# Patient Record
Sex: Female | Born: 1945 | Race: Black or African American | Hispanic: No | Marital: Married | Smoking: Former smoker
Health system: Southern US, Community
[De-identification: ages and names within clinical notes are randomized; demographics above are authoritative.]

## PROBLEM LIST (undated history)

## (undated) DIAGNOSIS — N189 Chronic kidney disease, unspecified: Secondary | ICD-10-CM

## (undated) DIAGNOSIS — I10 Essential (primary) hypertension: Secondary | ICD-10-CM

## (undated) DIAGNOSIS — I739 Peripheral vascular disease, unspecified: Secondary | ICD-10-CM

## (undated) DIAGNOSIS — R0989 Other specified symptoms and signs involving the circulatory and respiratory systems: Secondary | ICD-10-CM

## (undated) DIAGNOSIS — I2089 Other forms of angina pectoris: Secondary | ICD-10-CM

## (undated) DIAGNOSIS — I208 Other forms of angina pectoris: Secondary | ICD-10-CM

## (undated) DIAGNOSIS — M109 Gout, unspecified: Secondary | ICD-10-CM

## (undated) DIAGNOSIS — E669 Obesity, unspecified: Secondary | ICD-10-CM

## (undated) DIAGNOSIS — M1990 Unspecified osteoarthritis, unspecified site: Secondary | ICD-10-CM

## (undated) DIAGNOSIS — E785 Hyperlipidemia, unspecified: Secondary | ICD-10-CM

## (undated) DIAGNOSIS — I480 Paroxysmal atrial fibrillation: Secondary | ICD-10-CM

## (undated) DIAGNOSIS — R011 Cardiac murmur, unspecified: Secondary | ICD-10-CM

## (undated) DIAGNOSIS — K635 Polyp of colon: Secondary | ICD-10-CM

## (undated) DIAGNOSIS — I219 Acute myocardial infarction, unspecified: Secondary | ICD-10-CM

## (undated) DIAGNOSIS — I509 Heart failure, unspecified: Secondary | ICD-10-CM

## (undated) DIAGNOSIS — I2510 Atherosclerotic heart disease of native coronary artery without angina pectoris: Secondary | ICD-10-CM

## (undated) HISTORY — DX: Essential (primary) hypertension: I10

## (undated) HISTORY — DX: Paroxysmal atrial fibrillation: I48.0

## (undated) HISTORY — DX: Hyperlipidemia, unspecified: E78.5

## (undated) HISTORY — DX: Atherosclerotic heart disease of native coronary artery without angina pectoris: I25.10

## (undated) HISTORY — DX: Other forms of angina pectoris: I20.8

## (undated) HISTORY — DX: Polyp of colon: K63.5

## (undated) HISTORY — DX: Peripheral vascular disease, unspecified: I73.9

## (undated) HISTORY — DX: Chronic kidney disease, unspecified: N18.9

## (undated) HISTORY — PX: LEG AMPUTATION BELOW KNEE: SHX694

## (undated) HISTORY — DX: Unspecified osteoarthritis, unspecified site: M19.90

## (undated) HISTORY — DX: Other forms of angina pectoris: I20.89

## (undated) HISTORY — DX: Gout, unspecified: M10.9

## (undated) HISTORY — DX: Obesity, unspecified: E66.9

## (undated) HISTORY — DX: Other specified symptoms and signs involving the circulatory and respiratory systems: R09.89

## (undated) HISTORY — DX: Cardiac murmur, unspecified: R01.1

## (undated) HISTORY — DX: Heart failure, unspecified: I50.9

## (undated) HISTORY — DX: Acute myocardial infarction, unspecified: I21.9

---

## 1995-10-13 HISTORY — PX: CORONARY ARTERY BYPASS GRAFT: SHX141

## 2005-01-12 ENCOUNTER — Ambulatory Visit: Payer: Self-pay | Admitting: Internal Medicine

## 2005-07-20 ENCOUNTER — Ambulatory Visit: Payer: Self-pay | Admitting: Internal Medicine

## 2005-08-25 ENCOUNTER — Inpatient Hospital Stay: Payer: Self-pay | Admitting: Internal Medicine

## 2005-08-25 ENCOUNTER — Other Ambulatory Visit: Payer: Self-pay

## 2005-08-26 ENCOUNTER — Other Ambulatory Visit: Payer: Self-pay

## 2006-06-28 ENCOUNTER — Inpatient Hospital Stay: Payer: Self-pay | Admitting: Internal Medicine

## 2006-07-15 ENCOUNTER — Emergency Department: Payer: Self-pay | Admitting: Emergency Medicine

## 2006-11-02 ENCOUNTER — Ambulatory Visit: Payer: Self-pay | Admitting: Internal Medicine

## 2007-03-21 ENCOUNTER — Ambulatory Visit: Payer: Self-pay | Admitting: Internal Medicine

## 2007-12-02 ENCOUNTER — Ambulatory Visit: Payer: Self-pay | Admitting: Internal Medicine

## 2008-02-12 ENCOUNTER — Other Ambulatory Visit: Payer: Self-pay

## 2008-02-12 ENCOUNTER — Inpatient Hospital Stay: Payer: Self-pay | Admitting: Internal Medicine

## 2008-02-13 ENCOUNTER — Other Ambulatory Visit: Payer: Self-pay

## 2008-02-17 ENCOUNTER — Other Ambulatory Visit: Payer: Self-pay

## 2008-03-31 ENCOUNTER — Inpatient Hospital Stay: Payer: Self-pay | Admitting: Internal Medicine

## 2008-03-31 ENCOUNTER — Other Ambulatory Visit: Payer: Self-pay

## 2008-04-01 ENCOUNTER — Other Ambulatory Visit: Payer: Self-pay

## 2008-04-07 ENCOUNTER — Other Ambulatory Visit: Payer: Self-pay

## 2008-10-29 ENCOUNTER — Emergency Department: Payer: Self-pay | Admitting: Emergency Medicine

## 2009-08-31 IMAGING — CR DG CHEST 1V PORT
1 series · 1 of 1 positions shown · non-contrast
Comparison: none

REASON FOR EXAM: cp
COMMENTS:

[view not recorded]
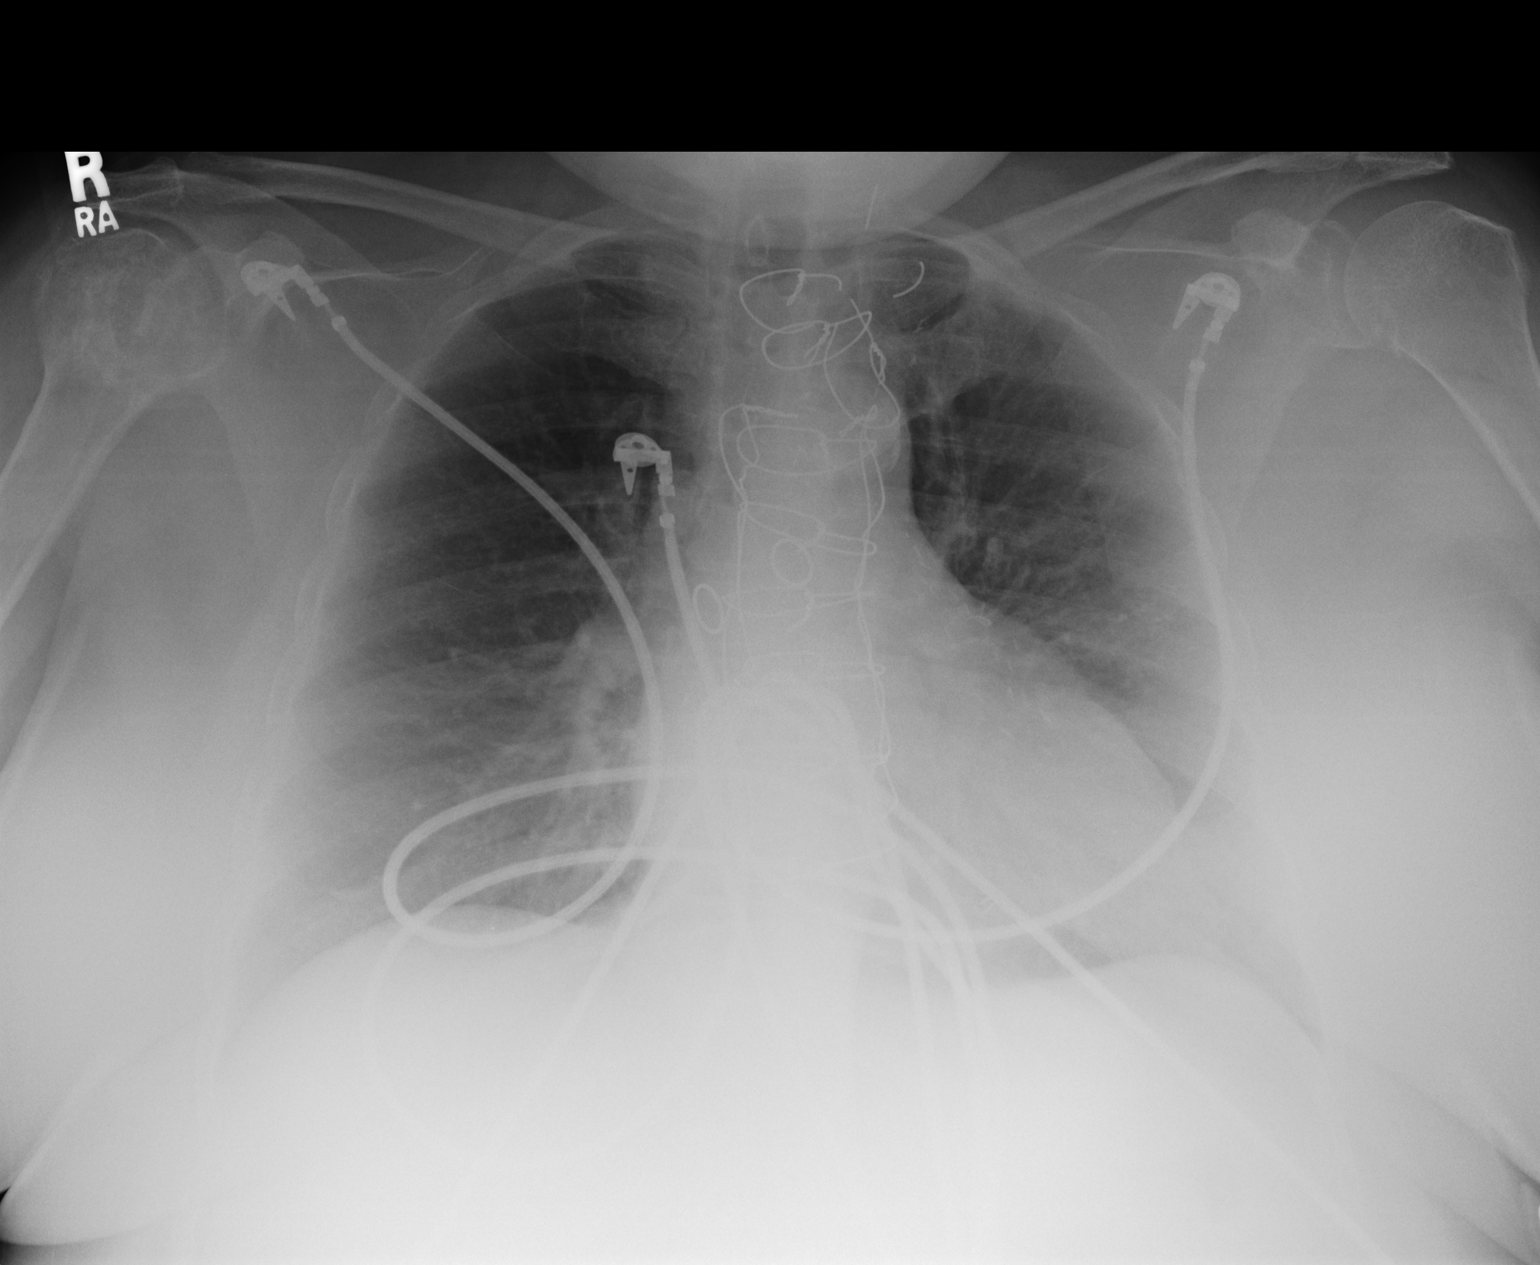

[1 of 1 positions shown; findings below may reference images not displayed]

PROCEDURE:     DXR - DXR PORTABLE CHEST SINGLE VIEW  - March 31, 2008  [DATE]

RESULT:     Comparison is made to prior study dated 02/14/08.

The patient has taken a shallow inspiration. With technique taken into
consideration the patient is status post median sternotomy and coronary
artery bypass grafting. The cardiac silhouette is moderately enlarged.
IMPRESSION:

## 2009-12-17 ENCOUNTER — Inpatient Hospital Stay: Payer: Self-pay | Admitting: Internal Medicine

## 2010-06-28 ENCOUNTER — Inpatient Hospital Stay: Payer: Self-pay | Admitting: Internal Medicine

## 2010-08-09 ENCOUNTER — Inpatient Hospital Stay: Payer: Self-pay | Admitting: Internal Medicine

## 2011-07-22 ENCOUNTER — Ambulatory Visit: Payer: Self-pay | Admitting: Internal Medicine

## 2011-08-30 ENCOUNTER — Emergency Department: Payer: Self-pay | Admitting: Emergency Medicine

## 2011-11-29 LAB — COMPREHENSIVE METABOLIC PANEL
Albumin: 3.3 g/dL — ABNORMAL LOW (ref 3.4–5.0)
Alkaline Phosphatase: 102 U/L (ref 50–136)
BUN: 51 mg/dL — ABNORMAL HIGH (ref 7–18)
Bilirubin,Total: 0.2 mg/dL (ref 0.2–1.0)
Chloride: 100 mmol/L (ref 98–107)
Creatinine: 1.93 mg/dL — ABNORMAL HIGH (ref 0.60–1.30)
EGFR (African American): 33 — ABNORMAL LOW
Glucose: 205 mg/dL — ABNORMAL HIGH (ref 65–99)
SGOT(AST): 14 U/L — ABNORMAL LOW (ref 15–37)
SGPT (ALT): 13 U/L
Total Protein: 9.2 g/dL — ABNORMAL HIGH (ref 6.4–8.2)

## 2011-11-29 LAB — APTT: Activated PTT: 27.5 secs (ref 23.6–35.9)

## 2011-11-29 LAB — CBC
MCH: 27 pg (ref 26.0–34.0)
MCV: 83 fL (ref 80–100)
WBC: 16.4 10*3/uL — ABNORMAL HIGH (ref 3.6–11.0)

## 2011-11-29 LAB — PROTIME-INR: Prothrombin Time: 14.1 secs (ref 11.5–14.7)

## 2011-11-29 IMAGING — CT CT ABD-PELV W/O CM
1 of 2 series · 15 of 32 positions shown, 19 images · non-contrast
Comparison: None

REASON FOR EXAM: (1) abd pain; (2) abd pain
COMMENTS:

PROCEDURE:     CT  - CT ABDOMEN AND PELVIS W[DATE]  [DATE]
RESULT:     Indication: Abdominal pain 02/15/2008
TECHNIQUE: Multiple axial images from the lung bases to the symphysis pubis
were obtained with oral and without intravenous contrast.

[Series 2: 3mm soft tissue · axial · 0.80mm/px · z∈[-937,-511]mm · 15 of 156 slices shown, 19 images]
[im 7/156  soft-tissue]
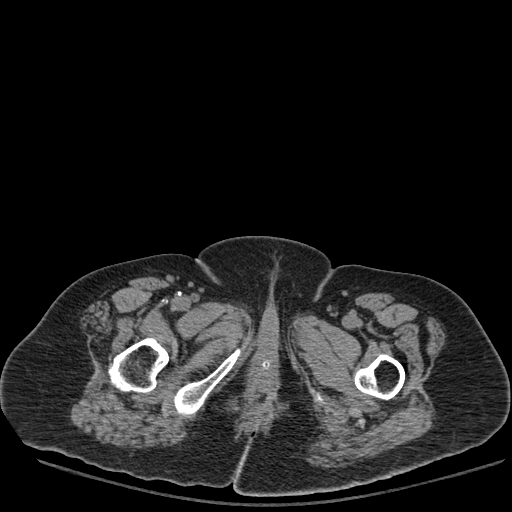
[im 7/156  bone]
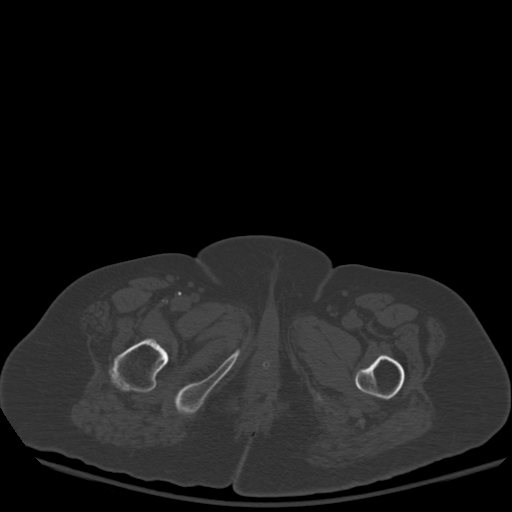
[im 21/156  soft-tissue]
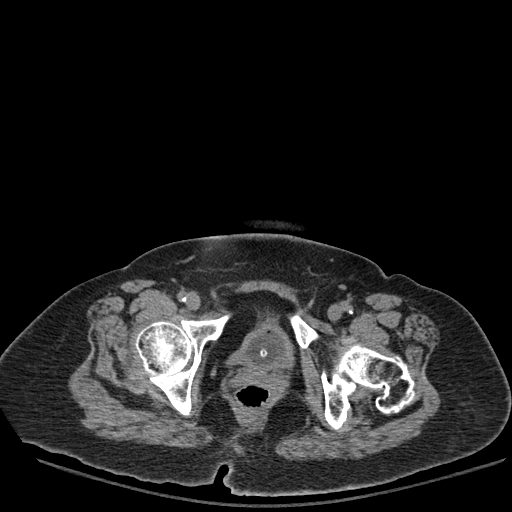
[im 34/156  soft-tissue]
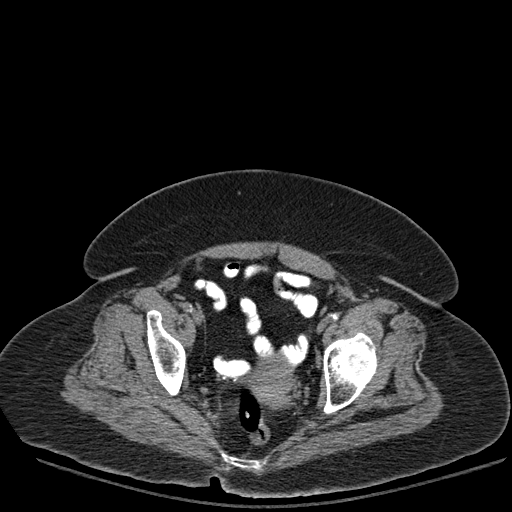
[im 41/156  soft-tissue]
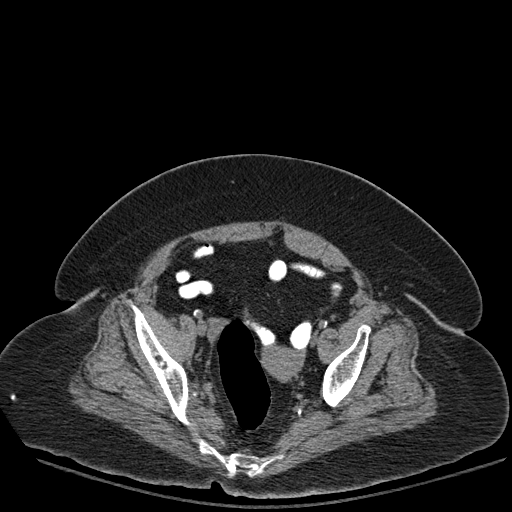
[im 54/156  soft-tissue]
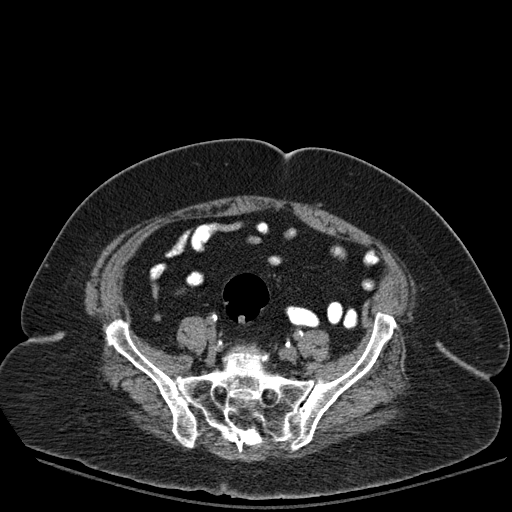
[im 68/156  soft-tissue]
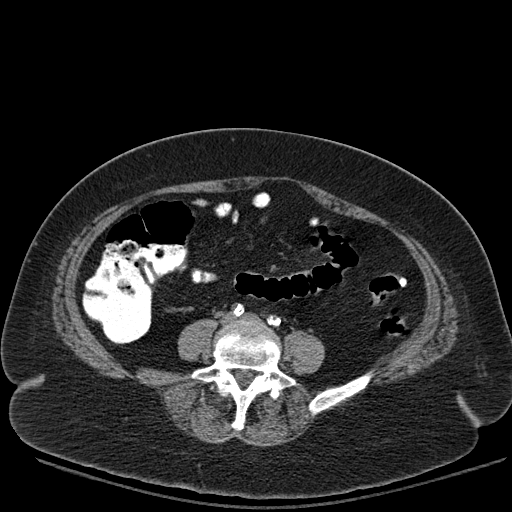
[im 81/156  soft-tissue]
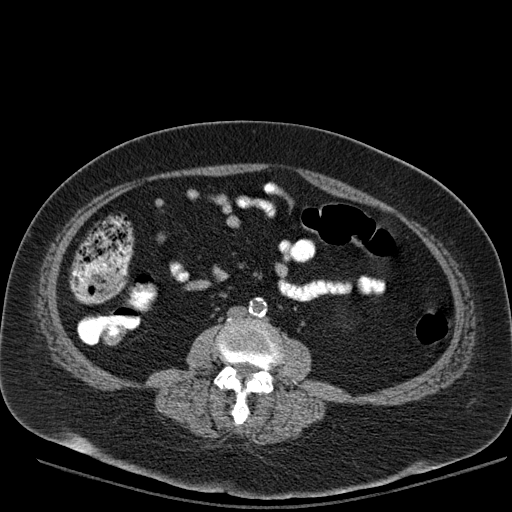
[im 88/156  soft-tissue]
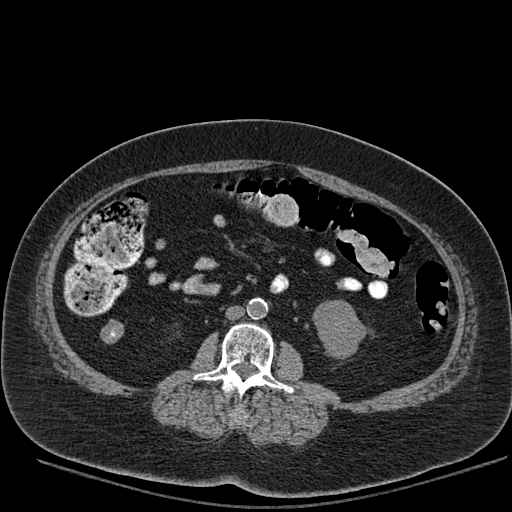
[im 102/156  soft-tissue]
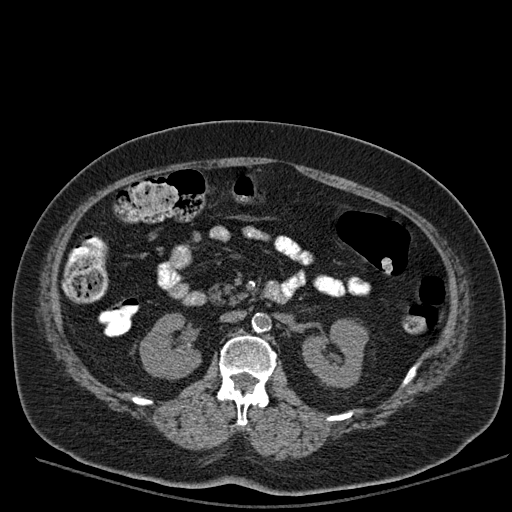
[im 102/156  bone]
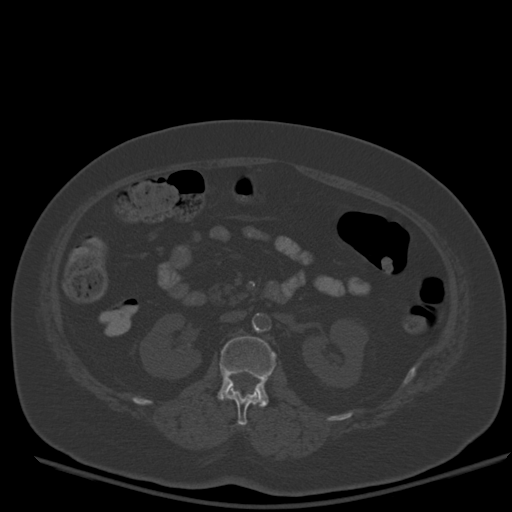
[im 115/156  soft-tissue]
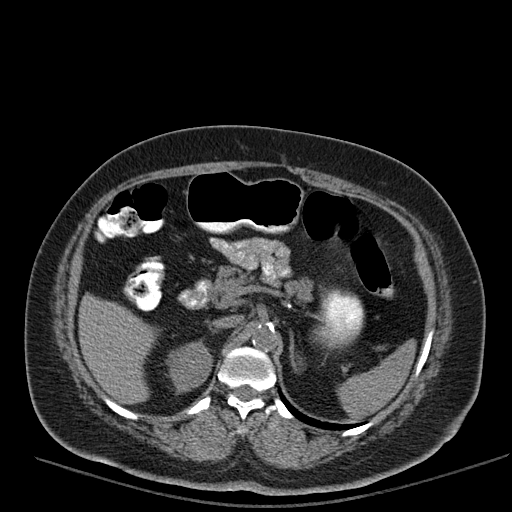
[im 122/156  soft-tissue]
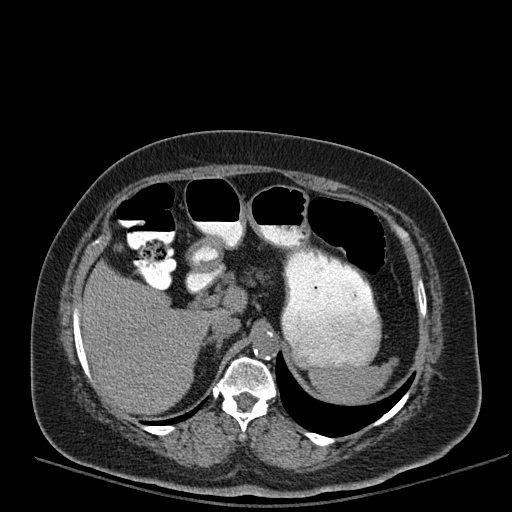
[im 129/156  lung]
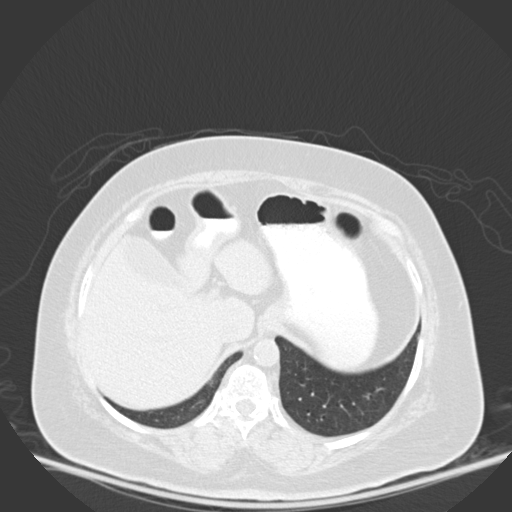
[im 135/156  soft-tissue]
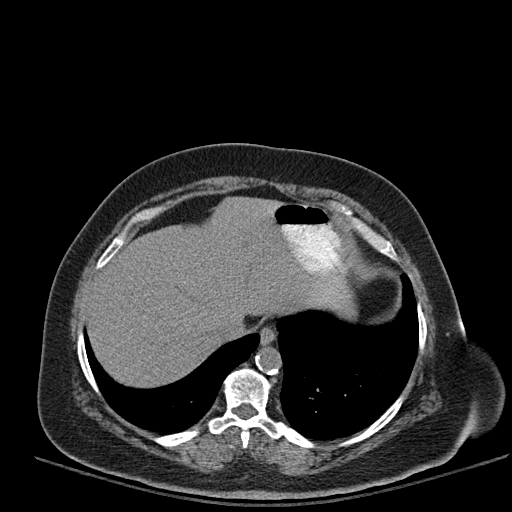
[im 135/156  lung]
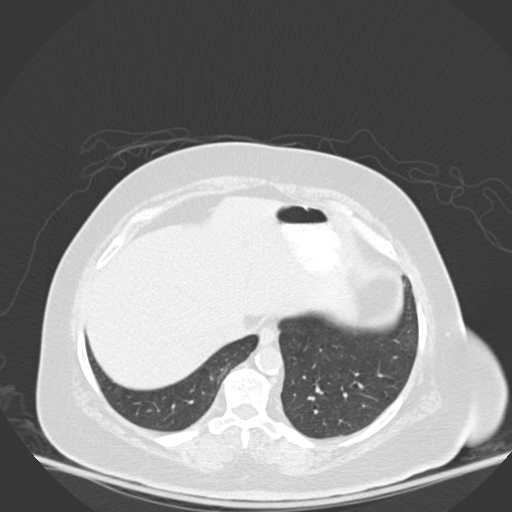
[im 142/156  lung]
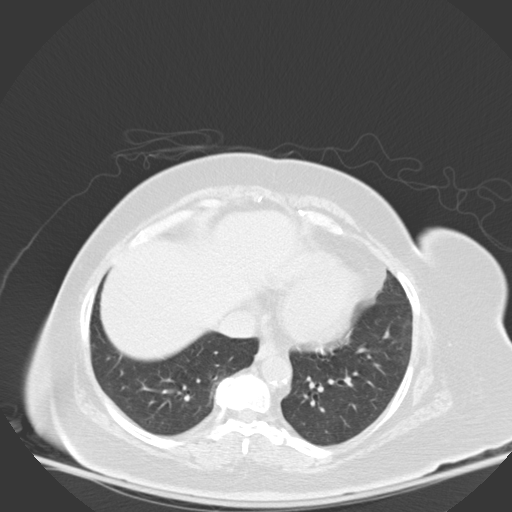
[im 149/156  soft-tissue]
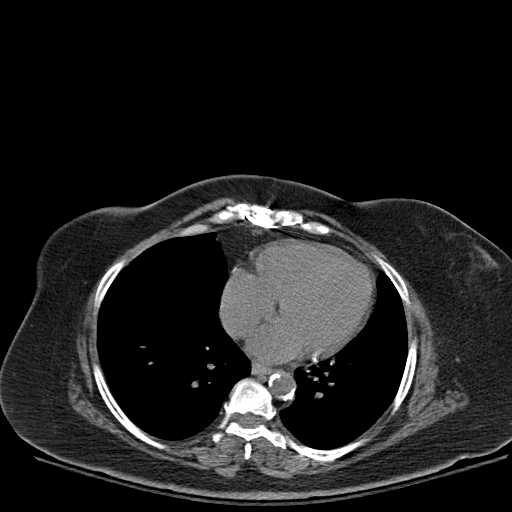
[im 149/156  lung]
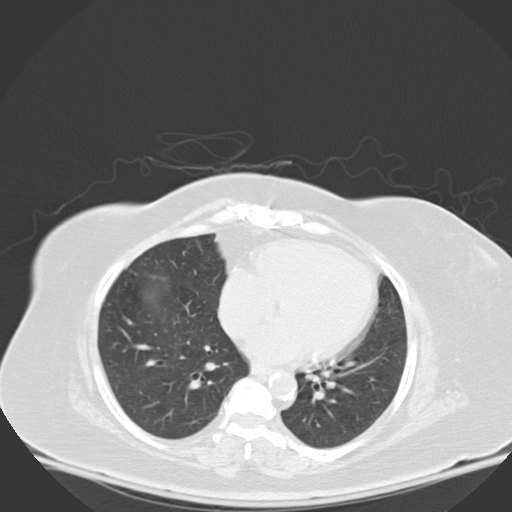

[15 of 32 positions shown; findings below may reference images not displayed]

FINDINGS: The lung bases are clear. There is no pleural or pericardial effusions.
There is coronary artery atherosclerosis involving the right coronary artery
and circumflex coronary artery.

No renal, ureteral, or bladder calculi. No obstructive uropathy. No
perinephric stranding is seen. The kidneys are symmetric in size without
evidence for exophytic mass. There is a Foley catheter within a decompressed
bladder.

The liver demonstrates no focal abnormality. The gallbladder is
unremarkable. The spleen demonstrates no focal abnormality. The adrenal
glands and pancreas are normal.

The stomach, duodenum, small intestine, and large intestine are
unremarkable. There is a normal caliber appendix in the right lower quadrant
without periappendiceal inflammatory changes. There is no pneumoperitoneum,
pneumatosis, or portal venous gas. There is no abdominal or pelvic free
fluid. There is no lymphadenopathy.

The abdominal aorta is normal in caliber with atherosclerosis.

There is lumbar spine spondylosis.
IMPRESSION: 1. No acute abdominal or pelvic pathology.

## 2011-11-29 IMAGING — CR DG CHEST 1V PORT
1 series · 1 of 1 positions shown · non-contrast
Comparison: none

REASON FOR EXAM: line placement
COMMENTS:

PROCEDURE:     DXR - DXR PORTABLE CHEST SINGLE VIEW  - June 29, 2010  [DATE]
RESULT:     Comparison: 06/28/2010

[view not recorded]
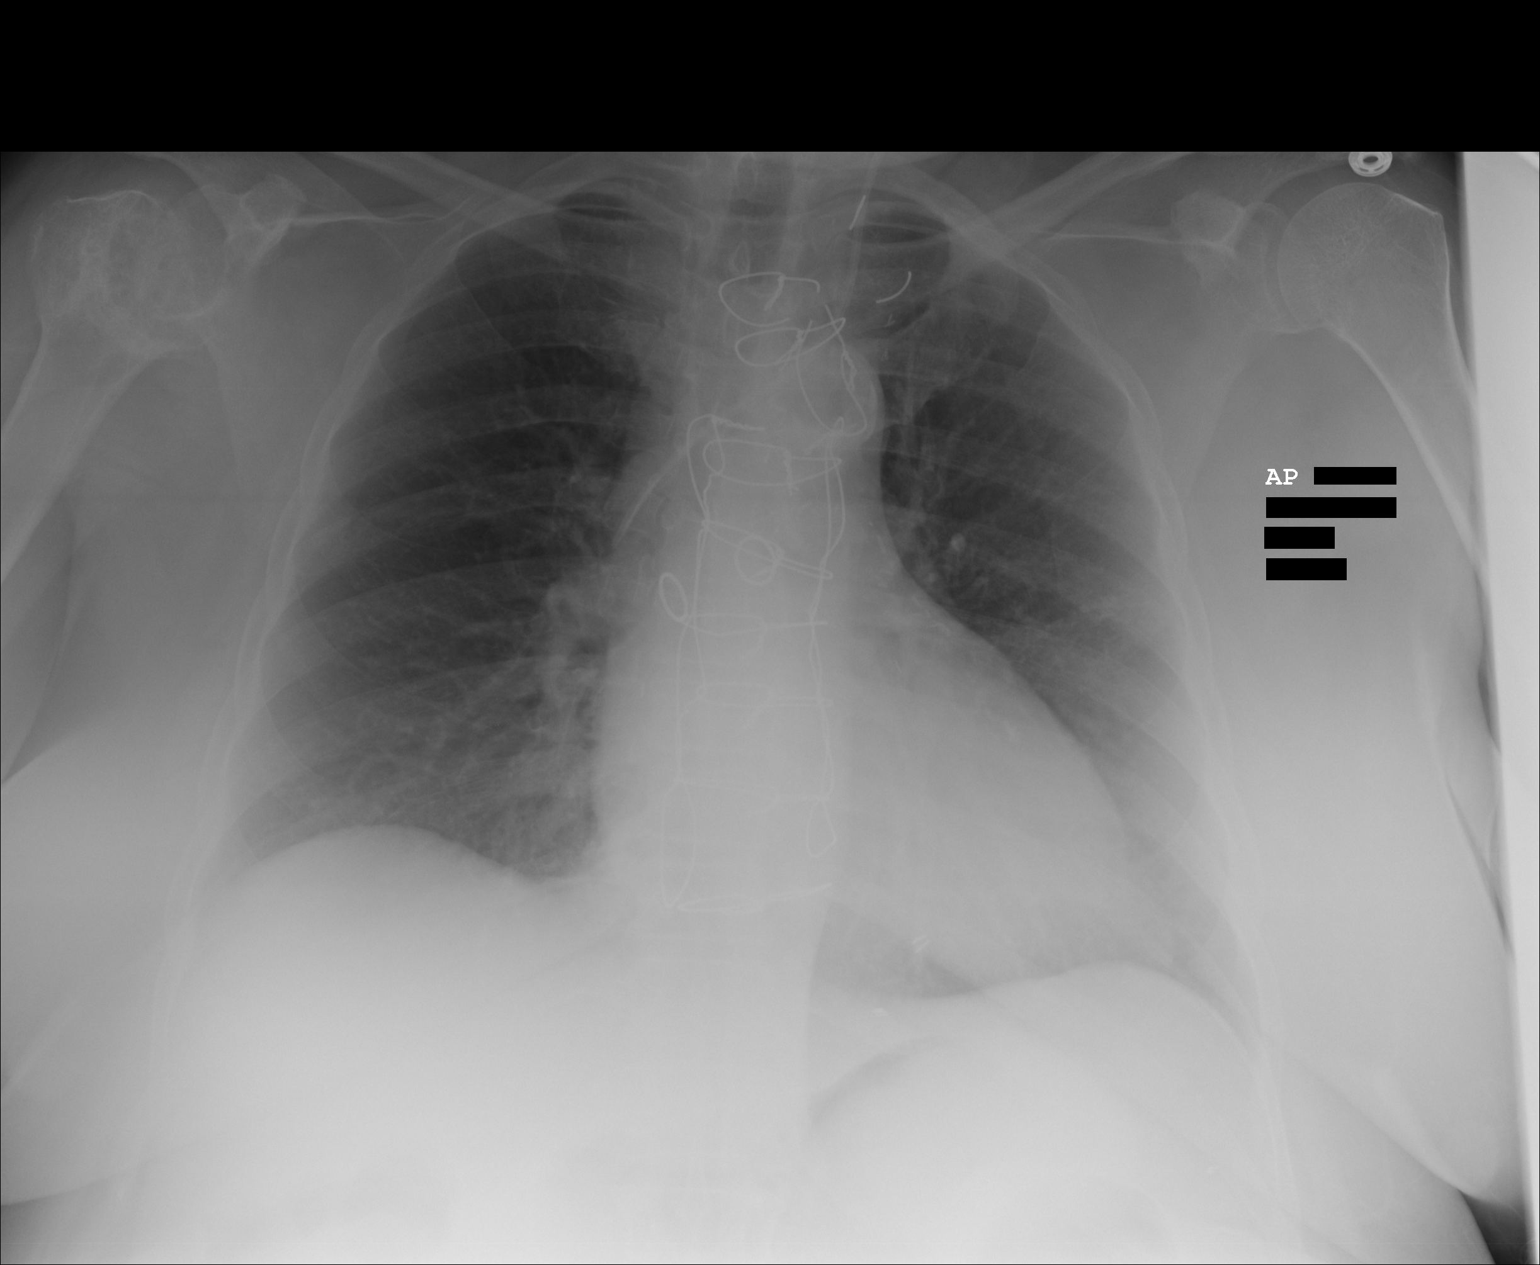

[1 of 1 positions shown; findings below may reference images not displayed]

FINDINGS: Single portable AP chest radiograph is provided. There is a left-sided
central venous catheter with the tip projecting over the SVC. There is no
focal parenchymal opacity, pleural effusion, or pneumothorax. Normal
cardiomediastinal silhouette. There is evidence of prior CABG. The osseous
structures are unremarkable.
IMPRESSION: No acute disease of the chest.

There is a left-sided central venous catheter with the tip projecting over
the SVC.

## 2011-11-30 ENCOUNTER — Inpatient Hospital Stay: Payer: Self-pay | Admitting: Specialist

## 2011-11-30 LAB — URINALYSIS, COMPLETE
Hyaline Cast: 3
Leukocyte Esterase: NEGATIVE
Nitrite: NEGATIVE
Protein: 100
Specific Gravity: 1.018 (ref 1.003–1.030)

## 2011-11-30 LAB — MAGNESIUM: Magnesium: 1.6 mg/dL — ABNORMAL LOW

## 2011-11-30 LAB — BASIC METABOLIC PANEL
Anion Gap: 10 (ref 7–16)
Chloride: 102 mmol/L (ref 98–107)
Co2: 24 mmol/L (ref 21–32)
EGFR (African American): 40 — ABNORMAL LOW
Glucose: 145 mg/dL — ABNORMAL HIGH (ref 65–99)
Osmolality: 287 (ref 275–301)
Potassium: 4 mmol/L (ref 3.5–5.1)
Sodium: 136 mmol/L (ref 136–145)

## 2011-11-30 LAB — CBC WITH DIFFERENTIAL/PLATELET
Basophil #: 0 10*3/uL (ref 0.0–0.1)
Eosinophil %: 0 %
HCT: 33.5 % — ABNORMAL LOW (ref 35.0–47.0)
Lymphocyte %: 8.9 %
MCHC: 32.2 g/dL (ref 32.0–36.0)
MCV: 83 fL (ref 80–100)
Monocyte #: 0.9 10*3/uL — ABNORMAL HIGH (ref 0.0–0.7)
Monocyte %: 4.7 %
Neutrophil #: 16.5 10*3/uL — ABNORMAL HIGH (ref 1.4–6.5)
Platelet: 586 10*3/uL — ABNORMAL HIGH (ref 150–440)
RBC: 4.02 10*6/uL (ref 3.80–5.20)
RDW: 18.7 % — ABNORMAL HIGH (ref 11.5–14.5)
WBC: 19.1 10*3/uL — ABNORMAL HIGH (ref 3.6–11.0)

## 2011-11-30 LAB — CK TOTAL AND CKMB (NOT AT ARMC)
CK, Total: 74 U/L (ref 21–215)
CK-MB: 5.7 ng/mL — ABNORMAL HIGH (ref 0.5–3.6)

## 2011-11-30 LAB — TROPONIN I: Troponin-I: 5.4 ng/mL — ABNORMAL HIGH

## 2011-11-30 LAB — LIPID PANEL
Cholesterol: 226 mg/dL — ABNORMAL HIGH (ref 0–200)
Ldl Cholesterol, Calc: 165 mg/dL — ABNORMAL HIGH (ref 0–100)
VLDL Cholesterol, Calc: 17 mg/dL (ref 5–40)

## 2011-11-30 LAB — HEMOGLOBIN A1C: Hemoglobin A1C: 6.5 % — ABNORMAL HIGH (ref 4.2–6.3)

## 2011-12-01 LAB — BASIC METABOLIC PANEL
BUN: 40 mg/dL — ABNORMAL HIGH (ref 7–18)
EGFR (African American): 47 — ABNORMAL LOW
EGFR (Non-African Amer.): 38 — ABNORMAL LOW
Glucose: 127 mg/dL — ABNORMAL HIGH (ref 65–99)
Osmolality: 283 (ref 275–301)
Potassium: 3.9 mmol/L (ref 3.5–5.1)
Sodium: 136 mmol/L (ref 136–145)

## 2012-03-02 ENCOUNTER — Inpatient Hospital Stay: Payer: Self-pay | Admitting: Internal Medicine

## 2012-03-02 LAB — TROPONIN I
Troponin-I: 0.1 ng/mL — ABNORMAL HIGH
Troponin-I: 0.2 ng/mL — ABNORMAL HIGH

## 2012-03-02 LAB — CK TOTAL AND CKMB (NOT AT ARMC)
CK, Total: 35 U/L (ref 21–215)
CK-MB: <0.5 ng/mL — ABNORMAL LOW (ref 0.5–3.6)

## 2012-03-02 LAB — CBC
HCT: 32.2 % — ABNORMAL LOW (ref 35.0–47.0)
HGB: 10.2 g/dL — ABNORMAL LOW (ref 12.0–16.0)
MCV: 83 fL (ref 80–100)
RBC: 3.86 10*6/uL (ref 3.80–5.20)
RDW: 18.4 % — ABNORMAL HIGH (ref 11.5–14.5)
WBC: 11.2 10*3/uL — ABNORMAL HIGH (ref 3.6–11.0)

## 2012-03-02 LAB — BASIC METABOLIC PANEL
Anion Gap: 11 (ref 7–16)
BUN: 59 mg/dL — ABNORMAL HIGH (ref 7–18)
Calcium, Total: 9.9 mg/dL (ref 8.5–10.1)
EGFR (Non-African Amer.): 26 — ABNORMAL LOW
Sodium: 135 mmol/L — ABNORMAL LOW (ref 136–145)

## 2012-03-02 LAB — APTT: Activated PTT: 68.3 secs — ABNORMAL HIGH (ref 23.6–35.9)

## 2012-03-03 LAB — CBC WITH DIFFERENTIAL/PLATELET
Basophil #: 0 10*3/uL (ref 0.0–0.1)
Basophil %: 0.3 %
Eosinophil %: 1.1 %
HCT: 29 % — ABNORMAL LOW (ref 35.0–47.0)
HGB: 9.3 g/dL — ABNORMAL LOW (ref 12.0–16.0)
Lymphocyte %: 19.9 %
MCH: 26.7 pg (ref 26.0–34.0)
MCV: 83 fL (ref 80–100)
Monocyte %: 5.7 %
Neutrophil #: 7.6 10*3/uL — ABNORMAL HIGH (ref 1.4–6.5)
Neutrophil %: 73 %
RBC: 3.49 10*6/uL — ABNORMAL LOW (ref 3.80–5.20)
RDW: 19.1 % — ABNORMAL HIGH (ref 11.5–14.5)

## 2012-03-03 LAB — MAGNESIUM: Magnesium: 2.5 mg/dL — ABNORMAL HIGH

## 2012-03-03 LAB — COMPREHENSIVE METABOLIC PANEL
Albumin: 3.5 g/dL (ref 3.4–5.0)
Anion Gap: 8 (ref 7–16)
BUN: 46 mg/dL — ABNORMAL HIGH (ref 7–18)
Bilirubin,Total: 0.3 mg/dL (ref 0.2–1.0)
Calcium, Total: 9.8 mg/dL (ref 8.5–10.1)
EGFR (African American): 39 — ABNORMAL LOW
EGFR (Non-African Amer.): 34 — ABNORMAL LOW
Glucose: 107 mg/dL — ABNORMAL HIGH (ref 65–99)
SGOT(AST): 13 U/L — ABNORMAL LOW (ref 15–37)
SGPT (ALT): 12 U/L

## 2012-03-03 LAB — TROPONIN I: Troponin-I: 0.19 ng/mL — ABNORMAL HIGH

## 2012-03-03 LAB — APTT: Activated PTT: >160 secs (ref 23.6–35.9)

## 2012-04-20 ENCOUNTER — Encounter: Payer: Self-pay | Admitting: Internal Medicine

## 2012-05-12 ENCOUNTER — Encounter: Payer: Self-pay | Admitting: Internal Medicine

## 2012-06-12 ENCOUNTER — Encounter: Payer: Self-pay | Admitting: Internal Medicine

## 2012-12-23 ENCOUNTER — Encounter: Payer: Self-pay | Admitting: Cardiothoracic Surgery

## 2012-12-23 ENCOUNTER — Encounter: Payer: Self-pay | Admitting: Nurse Practitioner

## 2013-01-10 ENCOUNTER — Encounter: Payer: Self-pay | Admitting: Cardiothoracic Surgery

## 2013-01-10 ENCOUNTER — Encounter: Payer: Self-pay | Admitting: Nurse Practitioner

## 2014-02-09 ENCOUNTER — Encounter (INDEPENDENT_AMBULATORY_CARE_PROVIDER_SITE_OTHER): Payer: Self-pay

## 2014-02-09 ENCOUNTER — Ambulatory Visit (INDEPENDENT_AMBULATORY_CARE_PROVIDER_SITE_OTHER): Payer: Medicare Other | Admitting: Adult Health

## 2014-02-09 ENCOUNTER — Encounter: Payer: Self-pay | Admitting: Adult Health

## 2014-02-09 VITALS — BP 135/73 | HR 73 | Temp 97.4°F | Resp 14 | Ht 62.0 in | Wt 197.0 lb

## 2014-02-09 DIAGNOSIS — E785 Hyperlipidemia, unspecified: Secondary | ICD-10-CM | POA: Insufficient documentation

## 2014-02-09 DIAGNOSIS — R3989 Other symptoms and signs involving the genitourinary system: Secondary | ICD-10-CM

## 2014-02-09 DIAGNOSIS — R399 Unspecified symptoms and signs involving the genitourinary system: Secondary | ICD-10-CM

## 2014-02-09 DIAGNOSIS — R809 Proteinuria, unspecified: Secondary | ICD-10-CM

## 2014-02-09 LAB — POCT URINALYSIS DIPSTICK
BILIRUBIN UA: NEGATIVE
Blood, UA: NEGATIVE
GLUCOSE UA: NEGATIVE
KETONES UA: NEGATIVE
LEUKOCYTES UA: NEGATIVE
Nitrite, UA: NEGATIVE
PH UA: 5
Spec Grav, UA: 1.015
Urobilinogen, UA: 0.2

## 2014-02-09 LAB — URINALYSIS, ROUTINE W REFLEX MICROSCOPIC
Bilirubin Urine: NEGATIVE
Hgb urine dipstick: NEGATIVE
Ketones, ur: NEGATIVE
Leukocytes, UA: NEGATIVE
NITRITE: NEGATIVE
PH: 5.5 (ref 5.0–8.0)
RBC / HPF: NONE SEEN (ref 0–?)
SPECIFIC GRAVITY, URINE: 1.015 (ref 1.000–1.030)
Total Protein, Urine: NEGATIVE
Urine Glucose: NEGATIVE
Urobilinogen, UA: 0.2 (ref 0.0–1.0)
WBC, UA: NONE SEEN (ref 0–?)

## 2014-02-09 NOTE — Progress Notes (Signed)
Patient ID: Laura Duran, female   DOB: 04-Mar-1946, 68 y.o.   MRN: [id number]   Subjective:    Patient ID: Laura BudAundrue Trapp, female    DOB: 04-Mar-1946, 68 y.o.   MRN: [id number]  HPI  Pt is a pleasant 68 year old female with history of hypertension, hyperlipidemia, coronary artery disease status post CABG x4 (1997) at [facility] who presents to clinic to establish care. She was previously followed by Dr. Maryellen PileEason. Patient sees her cardiologist, Dr. Juliann Paresallwood, every 6 months. Pt is very concerned about her cholesterol. She is on pravastatin 20 mg. Reports last time it was still elevated. Has had a hard time with statins causing myalgias. The dose she is currently on has been tolerable. Pt is not fasting today.  Pt reports that she believes she may have a urinary tract infection. She is not experiencing dysuria or frequency. She reports it is the appearance of her urine that is making her think she may have an infection. She reports there are bubbles in the urine. No fever or chills.    Past Medical History  Diagnosis Date  . Arthritis   . Hypertension   . Hyperlipidemia   . Gout   . Heart attack 1997, 2001    Dr. Juliann Paresallwood every 6 month     Past Surgical History  Procedure Laterality Date  . Coronary artery bypass graft  1997    x 4. [facility]  . 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 [facility]     Family History  Problem Relation Age of Onset  . Diabetes Mother   . Kidney disease Father     Kidney tumor  . Hypertension Sister      History   Social History  . Marital Status: Married    Spouse Name: N/A    Number of Children: 1  . Years of Education: 14   Occupational History  . Control Analyst     Retired   Social History Main Topics  . Smoking status: Former Games developermoker  . Smokeless tobacco: Never Used  . Alcohol Use: No  . Drug Use: No  . Sexual Activity: Not on file   Other Topics Concern  . Not on file   Social History Narrative   [place]heryll [place] in [place], [place]  She is married to Dupuyerharles. They have 1 son [name](Bernard), 2 granddaughters [name](Gabrielle, HillsboroMadalyn) and 1 grandson [name](Zachary). [name] is retired. She enjoys singing. She sings in the church choir. She also enjoyed her cowboy movies.    Review of Systems  Constitutional: Negative.   HENT: Negative.   Eyes: Negative.   Respiratory: Negative.  Negative for cough, shortness of breath and wheezing.   Cardiovascular: Negative.  Negative for chest pain and leg swelling.  Gastrointestinal: Negative.   Endocrine: Negative.   Genitourinary: Positive for urgency. Negative for dysuria, frequency, hematuria, flank pain and difficulty urinating.       "bubbles in the urine"  Musculoskeletal: Negative.        Left below knee amputation with prosthesis  Skin: Negative.   Allergic/Immunologic: Negative.   Neurological: Negative.   Hematological: Negative.   Psychiatric/Behavioral: Negative.        Objective:  BP 135/73  Pulse 73  Temp(Src) 97.4 F (36.3 C) (Oral)  Resp 14  Ht 5\' 2"  (1.575 m)  Wt 197 lb (89.359 kg)  BMI 36.02 kg/m2  SpO2 99%   Physical Exam  Constitutional: She is oriented to person, place, and time. No distress.  Pleasant 68 y/o female  HENT:  Head: Normocephalic and atraumatic.  Eyes: Conjunctivae and EOM are normal.  Neck: Normal range of motion. Neck supple.  Cardiovascular: Normal rate, regular rhythm, normal heart sounds and intact distal pulses.  Exam reveals no gallop and no friction rub.   No murmur heard. Pulmonary/Chest: Effort normal and breath sounds normal. No respiratory distress. She has no wheezes. She has no rales.  Genitourinary:  No costovertebral angle tenderness. No suprapubic discomfort.  Musculoskeletal: Normal range of motion.  Neurological: She is alert and oriented to person, place, and time. She has normal reflexes. Coordination normal.  Skin: Skin is warm and dry.  Psychiatric: She has a normal mood and affect. Her behavior is normal. Judgment and  thought content normal.      Assessment & Plan:   1. HLD (hyperlipidemia) Concerned about her cholesterol. On pravastatin 20 mg. She will schedule her Medicare Wellness and I will check labs including her HLD. Instructed to fast.  2. Urinary tract infection symptoms UA does not show any evidence of UTI. There was trace protein in her urine. Send for urinalysis. - POCT urinalysis dipstick  3. Proteinuria Urine sent to evaluate for proteinuria. - Urinalysis, Routine w reflex microscopic

## 2014-02-09 NOTE — Progress Notes (Signed)
Pre visit review using our clinic review tool, if applicable. No additional management support is needed unless otherwise documented below in the visit note. 

## 2014-02-09 NOTE — Patient Instructions (Signed)
   Thank you for choosing [facility].  Please schedule your Medicare Wellness Exam at your earliest convenience. I will do lab work so I will need you to be fasting. You may drink water.

## 2014-02-11 ENCOUNTER — Encounter: Payer: Self-pay | Admitting: Adult Health

## 2014-02-11 ENCOUNTER — Telehealth: Payer: Self-pay | Admitting: Adult Health

## 2014-02-11 NOTE — Telephone Encounter (Signed)
Please call pt and ask her if she has a kidney doctor. If yes, when was the last time she was seen?

## 2014-02-12 ENCOUNTER — Other Ambulatory Visit: Payer: Self-pay | Admitting: Adult Health

## 2014-02-12 DIAGNOSIS — N189 Chronic kidney disease, unspecified: Secondary | ICD-10-CM

## 2014-02-12 NOTE — Telephone Encounter (Signed)
Spoke with patient, she stated she does not see a kidney doctor at all.

## 2014-02-12 NOTE — Telephone Encounter (Signed)
I received her records from Dr. Juliann Paresallwood which states that she has chronic renal insufficiency. There were no labs on her kidneys. I still have not received the records from Dr. Maryellen PileEason. Please have her come in to check labs. If renal function is not normal I will refer her to a kidney specialist.

## 2014-02-13 NOTE — Telephone Encounter (Signed)
Patient was unaware of this and will come in tomorrow for labs, please put orders in.

## 2014-02-13 NOTE — Telephone Encounter (Signed)
The order were placed even before I sent you the message

## 2014-02-14 ENCOUNTER — Other Ambulatory Visit: Payer: Medicare Other

## 2014-04-04 ENCOUNTER — Other Ambulatory Visit: Payer: Self-pay | Admitting: Adult Health

## 2014-04-04 NOTE — Telephone Encounter (Signed)
Last refill 5.21.15, last OV 5.1.15.  Please advise refill

## 2014-04-04 NOTE — Telephone Encounter (Signed)
Okay to refill? 

## 2014-04-05 ENCOUNTER — Ambulatory Visit (INDEPENDENT_AMBULATORY_CARE_PROVIDER_SITE_OTHER): Payer: Medicare Other | Admitting: Adult Health

## 2014-04-05 ENCOUNTER — Other Ambulatory Visit: Payer: Self-pay | Admitting: Adult Health

## 2014-04-05 ENCOUNTER — Encounter: Payer: Self-pay | Admitting: Adult Health

## 2014-04-05 ENCOUNTER — Telehealth: Payer: Self-pay | Admitting: Adult Health

## 2014-04-05 VITALS — BP 148/86 | HR 70 | Temp 97.8°F | Resp 16 | Ht 62.0 in | Wt 191.5 lb

## 2014-04-05 DIAGNOSIS — I10 Essential (primary) hypertension: Secondary | ICD-10-CM

## 2014-04-05 DIAGNOSIS — Z23 Encounter for immunization: Secondary | ICD-10-CM | POA: Diagnosis not present

## 2014-04-05 DIAGNOSIS — E559 Vitamin D deficiency, unspecified: Secondary | ICD-10-CM

## 2014-04-05 DIAGNOSIS — Z0000 Encounter for general adult medical examination without abnormal findings: Secondary | ICD-10-CM

## 2014-04-05 DIAGNOSIS — Z1239 Encounter for other screening for malignant neoplasm of breast: Secondary | ICD-10-CM

## 2014-04-05 DIAGNOSIS — E785 Hyperlipidemia, unspecified: Secondary | ICD-10-CM

## 2014-04-05 DIAGNOSIS — Z135 Encounter for screening for eye and ear disorders: Secondary | ICD-10-CM

## 2014-04-05 DIAGNOSIS — E538 Deficiency of other specified B group vitamins: Secondary | ICD-10-CM

## 2014-04-05 DIAGNOSIS — Z1283 Encounter for screening for malignant neoplasm of skin: Secondary | ICD-10-CM

## 2014-04-05 DIAGNOSIS — Z13820 Encounter for screening for osteoporosis: Secondary | ICD-10-CM

## 2014-04-05 LAB — CBC WITH DIFFERENTIAL/PLATELET
BASOS ABS: 0 10*3/uL (ref 0.0–0.1)
Basophils Relative: 0.3 % (ref 0.0–3.0)
EOS ABS: 0.1 10*3/uL (ref 0.0–0.7)
Eosinophils Relative: 1.4 % (ref 0.0–5.0)
HCT: 32.3 % — ABNORMAL LOW (ref 36.0–46.0)
Hemoglobin: 10.6 g/dL — ABNORMAL LOW (ref 12.0–15.0)
LYMPHS ABS: 2 10*3/uL (ref 0.7–4.0)
LYMPHS PCT: 23.1 % (ref 12.0–46.0)
MCHC: 33 g/dL (ref 30.0–36.0)
MCV: 86.2 fl (ref 78.0–100.0)
MONOS PCT: 4.8 % (ref 3.0–12.0)
Monocytes Absolute: 0.4 10*3/uL (ref 0.1–1.0)
Neutro Abs: 6.1 10*3/uL (ref 1.4–7.7)
Neutrophils Relative %: 70.4 % (ref 43.0–77.0)
Platelets: 322 10*3/uL (ref 150.0–400.0)
RBC: 3.75 Mil/uL — ABNORMAL LOW (ref 3.87–5.11)
RDW: 14.2 % (ref 11.5–15.5)
WBC: 8.7 10*3/uL (ref 4.0–10.5)

## 2014-04-05 LAB — LIPID PANEL
Cholesterol: 184 mg/dL (ref 0–200)
HDL: 46 mg/dL (ref >39.00)
LDL Cholesterol: 123 mg/dL — ABNORMAL HIGH (ref 0–99)
NONHDL: 138
Total CHOL/HDL Ratio: 4
Triglycerides: 74 mg/dL (ref 0.0–149.0)
VLDL: 14.8 mg/dL (ref 0.0–40.0)

## 2014-04-05 LAB — VITAMIN B12: Vitamin B-12: 415 pg/mL (ref 211–911)

## 2014-04-05 LAB — COMPREHENSIVE METABOLIC PANEL
ALT: 15 U/L (ref 0–35)
AST: 19 U/L (ref 0–37)
Albumin: 3.9 g/dL (ref 3.5–5.2)
Alkaline Phosphatase: 139 U/L — ABNORMAL HIGH (ref 39–117)
BUN: 22 mg/dL (ref 6–23)
CALCIUM: 9.6 mg/dL (ref 8.4–10.5)
CHLORIDE: 105 meq/L (ref 96–112)
CO2: 23 meq/L (ref 19–32)
Creatinine, Ser: 1.5 mg/dL — ABNORMAL HIGH (ref 0.4–1.2)
GFR: 43.62 mL/min — ABNORMAL LOW (ref >60.00)
GLUCOSE: 83 mg/dL (ref 70–99)
Potassium: 4.2 mEq/L (ref 3.5–5.1)
SODIUM: 137 meq/L (ref 135–145)
TOTAL PROTEIN: 7.5 g/dL (ref 6.0–8.3)
Total Bilirubin: 0.6 mg/dL (ref 0.2–1.2)

## 2014-04-05 LAB — VITAMIN D 25 HYDROXY (VIT D DEFICIENCY, FRACTURES): VITD: 11.84 ng/mL

## 2014-04-05 MED ORDER — PRAVASTATIN SODIUM 20 MG PO TABS: 20.0000 mg | ORAL_TABLET | Freq: Every day | ORAL | Status: DC

## 2014-04-05 MED ORDER — HYDRALAZINE HCL 25 MG PO TABS: 25.0000 mg | ORAL_TABLET | Freq: Three times a day (TID) | ORAL | Status: DC

## 2014-04-05 MED ORDER — TRAMADOL HCL 50 MG PO TABS: 50.0000 mg | ORAL_TABLET | Freq: Three times a day (TID) | ORAL | Status: DC | PRN

## 2014-04-05 MED ORDER — LOSARTAN POTASSIUM 50 MG PO TABS: 50.0000 mg | ORAL_TABLET | Freq: Every day | ORAL | Status: DC

## 2014-04-05 MED ORDER — CLOPIDOGREL BISULFATE 75 MG PO TABS: 75.0000 mg | ORAL_TABLET | Freq: Every day | ORAL | Status: DC

## 2014-04-05 MED ORDER — RANOLAZINE ER 500 MG PO TB12: 500.0000 mg | ORAL_TABLET | Freq: Two times a day (BID) | ORAL | Status: DC

## 2014-04-05 MED ORDER — FUROSEMIDE 20 MG PO TABS: 20.0000 mg | ORAL_TABLET | Freq: Every day | ORAL | Status: DC

## 2014-04-05 MED ORDER — CARVEDILOL 25 MG PO TABS: 25.0000 mg | ORAL_TABLET | Freq: Two times a day (BID) | ORAL | Status: DC

## 2014-04-05 NOTE — Progress Notes (Signed)
Pre visit review using our clinic review tool, if applicable. No additional management support is needed unless otherwise documented below in the visit note. 

## 2014-04-05 NOTE — Progress Notes (Signed)
Patient ID: Laura Duran, female   DOB: 1946/03/02, 68 y.o.   MRN: [id number]   Subjective:    Patient ID: Laura Duran, female    DOB: 06/09/1946, 69 y.o.   MRN: [id number]  HPI  The patient is here for annual Medicare wellness examination and management of other chronic and acute problems.   The risk factors are reflected in the social history.  The roster of all physicians providing medical care to patient is listed in the Snapshot section of the chart.  Activities of daily living:  The patient is 100% independent in all ADLs: dressing, toileting, bathing, feeding as well as independent mobility. Pt has Left BKA with prosthesis without problems with mobility.  Instrumental Activities of daily living: The patient is 100% independent in all iADLs: cooking, driving, keeping track of finances, managing medications, shopping, using telephone and computer.  Home safety: The patient has smoke detectors in the home. Seatbelts are worn 100%.  There are no firearms at home. There is no violence in the home. No hx of IPV.  There is no risks for hepatitis, STDs or HIV. There is no history of blood transfusion. No travel history to infectious disease endemic areas of the world.  The patient has seen dentist in the last six month. Pt has seen eye doctor in the last year. No hearing impairment.  No excessive sun exposure. Discussed the need for sun protection: hats, long sleeves and use of sunscreen if there is significant sun exposure.  Diet: the importance of a healthy diet is discussed.  The benefits of regular aerobic exercise were discussed. No exercise at present  Depression screen: there are no signs or vegative symptoms of depression- irritability, change in appetite, anhedonia, sadness/tearfullness.  Cognitive assessment: the patient manages all their financial and personal affairs and is actively engaged. Able to relate day,date,year and events; recalled 2/3 objects at 3 minutes;  performed clock-face test normally.  The following portions of the patient's history were reviewed and updated as appropriate: allergies, current medications, past family history, past medical history,  past surgical history, past social history  and problem list.  Visual acuity was not assessed per patient preference since has regular follow up with ophthalmologist. Hearing and body mass index were assessed and reviewed.   During the course of the visit the patient was educated and counseled about appropriate screening and preventive services including : fall prevention , diabetes screening, nutrition counseling, colorectal cancer screening, and recommended immunizations.     Current Outpatient Prescriptions on File Prior to Visit  Medication Sig Dispense Refill  . ALPRAZolam (XANAX) 0.5 MG tablet TAKE 1 TABLET BY MOUTH EVERY 12 HOURS AS NEEDED  60 tablet  4  . aspirin 81 MG tablet Take 81 mg by mouth daily.      . carvedilol (COREG) 25 MG tablet Take 25 mg by mouth 2 (two) times daily.      . clopidogrel (PLAVIX) 75 MG tablet Take 75 mg by mouth daily.      Marland Kitchen FOLIC ACID PO Take 1 mg by mouth daily.      . furosemide (LASIX) 20 MG tablet Take 20 mg by mouth. Once a day as needed for edema      . hydrALAZINE (APRESOLINE) 25 MG tablet Take 25 mg by mouth 3 (three) times daily.      . isosorbide mononitrate (IMDUR) 60 MG 24 hr tablet Take 60 mg by mouth 2 (two) times daily.      Marland Kitchen losartan (  COZAAR) 50 MG tablet Take 50 mg by mouth daily.      . pravastatin (PRAVACHOL) 20 MG tablet Take 20 mg by mouth daily.      Marland Kitchen. RANEXA 500 MG 12 hr tablet Take 500 mg by mouth 2 (two) times daily.       No current facility-administered medications on file prior to visit.     Review of Systems  Constitutional: Negative.   HENT: Negative.   Eyes: Negative.   Respiratory: Negative.   Cardiovascular: Negative.   Gastrointestinal: Negative.   Endocrine: Negative.   Genitourinary: Negative.     Musculoskeletal:       Left BKA with prosthesis. She needs to get it fixed. Rubbing against lateral leg and causing skin irritation.  Skin: Negative.        Multiple areas of skin discolorations, moles  Allergic/Immunologic: Negative.   Neurological: Negative.   Hematological: Negative.   Psychiatric/Behavioral: Negative.   All other systems reviewed and are negative.      Objective:  BP 149/78  Pulse 70  Temp(Src) 97.8 F (36.6 C) (Oral)  Resp 16  Wt 191 lb 8 oz (86.864 kg)  SpO2 99%   Physical Exam  Constitutional: She is oriented to person, place, and time. No distress.  Pleasant, overweight 68 y/o female. Well groomed.  HENT:  Head: Normocephalic and atraumatic.  Right Ear: External ear normal.  Left Ear: External ear normal.  Nose: Nose normal.  Mouth/Throat: Oropharynx is clear and moist.  Eyes: Conjunctivae and EOM are normal. Pupils are equal, round, and reactive to light.  Neck: Normal range of motion. Neck supple. No tracheal deviation present. No thyromegaly present.  Cardiovascular: Normal rate, regular rhythm, normal heart sounds and intact distal pulses.  Exam reveals no gallop and no friction rub.   No murmur heard. Pulmonary/Chest: Effort normal and breath sounds normal. No respiratory distress. She has no wheezes. She has no rales.  Abdominal: Soft. Bowel sounds are normal. She exhibits no distension and no mass. There is no tenderness. There is no rebound and no guarding.  Genitourinary:  Deferred  Musculoskeletal: Normal range of motion.  Left, BKA - prosthesis.  Lymphadenopathy:    She has no cervical adenopathy.  Neurological: She is alert and oriented to person, place, and time. She has normal reflexes. No cranial nerve deficit. Coordination normal.  Skin:  Multiple nevi throughout back, abdomen, breast. Several seborrheic keratosis. Hyperpigmentation of skin on back, abdomen.  Psychiatric: She has a normal mood and affect. Her behavior is normal.  Judgment and thought content normal.      Assessment & Plan:   1. Medicare annual wellness visit, subsequent Annual comprehensive exam was done including clinical breast exam. Pelvic/PAP exam deferred. All screenings have been addressed. Pt has been referred accordingly. Problems have been listed separately.  2. Essential hypertension On medication. Compliant. Refills sent to pharmacy. Check labs. Follow - CBC with Differential - Comprehensive metabolic panel  3. HLD (hyperlipidemia) On statin. Does not tolerate high dose. Check labs. Follow. - Lipid panel - Comprehensive metabolic panel  4. Screening for breast cancer Ordered mammogram. Will have this scheduled prior to leaving the office today. - MM DIGITAL SCREENING BILATERAL; Future  5. Unspecified vitamin D deficiency Check vitamin D level. - Vit D  25 hydroxy (rtn osteoporosis monitoring)  6. Vitamin B 12 deficiency Will check labs. Replace if indicated. Follow - Vitamin B12  7. Screening for skin cancer Multiple nevi, hyperpigmentation of skin. Refer to Dermatology  for full skin evaluation. - Ambulatory referral to Dermatology  8. Screening for osteoporosis Ordered dexa scan. - DG Bone Density; Future  9. Need for TD vaccine Received in clinic today. - Td vaccine preservative free greater than or equal to 7yo IM

## 2014-04-05 NOTE — Patient Instructions (Signed)
  Please have blood work done prior to leaving the office.  I will contact you with the results of your labs once they are available and I have had a chance to review them.  Please see Eber JonesCarolyn before leaving the office. We need to schedule your mammogram, DEXA scan and referral to dermatology.  I have prescribed tramadol 50 mg one tablet every 8 hours as needed for pain.  He received your tetanus vaccine this visit.  Recommend pneumonia and shingles vaccine. Please consider these.

## 2014-04-05 NOTE — Telephone Encounter (Signed)
Pt records indicate that she is taking imdur 60 mg (24 hr) bid. Can you please call and confirm that this is correct prior to sending in a refill for this medication? Thanks

## 2014-04-06 ENCOUNTER — Telehealth: Payer: Self-pay | Admitting: Adult Health

## 2014-04-06 ENCOUNTER — Other Ambulatory Visit: Payer: Self-pay | Admitting: Adult Health

## 2014-04-06 DIAGNOSIS — E559 Vitamin D deficiency, unspecified: Secondary | ICD-10-CM

## 2014-04-06 DIAGNOSIS — R7989 Other specified abnormal findings of blood chemistry: Secondary | ICD-10-CM

## 2014-04-06 MED ORDER — PRAVASTATIN SODIUM 20 MG PO TABS
20.0000 mg | ORAL_TABLET | Freq: Every day | ORAL | Status: DC
Start: 2014-04-06 — End: 2014-10-09

## 2014-04-06 MED ORDER — HYDRALAZINE HCL 25 MG PO TABS: 25.0000 mg | ORAL_TABLET | Freq: Three times a day (TID) | ORAL | Status: DC

## 2014-04-06 MED ORDER — CLOPIDOGREL BISULFATE 75 MG PO TABS: 75.0000 mg | ORAL_TABLET | Freq: Every day | ORAL | Status: DC

## 2014-04-06 MED ORDER — RANOLAZINE ER 500 MG PO TB12: 500.0000 mg | ORAL_TABLET | Freq: Two times a day (BID) | ORAL | Status: DC

## 2014-04-06 MED ORDER — CARVEDILOL 25 MG PO TABS: 25.0000 mg | ORAL_TABLET | Freq: Two times a day (BID) | ORAL | Status: DC

## 2014-04-06 MED ORDER — ISOSORBIDE MONONITRATE ER 60 MG PO TB24: 60.0000 mg | ORAL_TABLET | Freq: Two times a day (BID) | ORAL | Status: DC

## 2014-04-06 MED ORDER — ERGOCALCIFEROL 1.25 MG (50000 UT) PO CAPS: 50000.0000 [IU] | ORAL_CAPSULE | ORAL | Status: DC

## 2014-04-06 MED ORDER — LOSARTAN POTASSIUM 50 MG PO TABS: 50.0000 mg | ORAL_TABLET | Freq: Every day | ORAL | Status: DC

## 2014-04-06 MED ORDER — FUROSEMIDE 20 MG PO TABS: 20.0000 mg | ORAL_TABLET | Freq: Every day | ORAL | Status: DC

## 2014-04-06 NOTE — Telephone Encounter (Signed)
Pt confirmed, read off her bottle, that she is taking bid. Also states she needs her Rxs (all except Xanax and Tramadol) [facility]. Rx sent to pharmacy by [facility]

## 2014-04-06 NOTE — Telephone Encounter (Signed)
Relevant patient education assigned to patient using Emmi. ° °

## 2014-04-06 NOTE — Progress Notes (Signed)
Left message for pt to return my call.

## 2014-04-06 NOTE — Addendum Note (Signed)
Addended by: Chandra BatchNIXON, REBECCA E on: 04/06/2014 10:34 AM   Modules accepted: Orders

## 2014-04-09 ENCOUNTER — Other Ambulatory Visit: Payer: Self-pay | Admitting: Adult Health

## 2014-04-10 NOTE — Progress Notes (Signed)
Pt notified of Raquel's notes: "Kidney function is not good. Refer to Nephrology. Start Drisdol for vit d def. 1 capsule weekly for 12 weeks." Pt verbalized understanding. She will begin vitamin d weekly. Pt declines nephrology referral. States she has seen one in the past, she is "aware of her kidney function" and does not want to see a nephrologist. Lorain ChildesFYI

## 2014-04-12 ENCOUNTER — Telehealth: Payer: Self-pay | Admitting: Adult Health

## 2014-04-16 ENCOUNTER — Telehealth: Payer: Self-pay

## 2014-04-16 ENCOUNTER — Other Ambulatory Visit: Payer: Self-pay | Admitting: Adult Health

## 2014-04-16 ENCOUNTER — Ambulatory Visit: Payer: Medicare Other | Admitting: Adult Health

## 2014-04-16 MED ORDER — TAPENTADOL HCL 50 MG PO TABS: 50.0000 mg | ORAL_TABLET | Freq: Two times a day (BID) | ORAL | Status: DC | PRN

## 2014-04-16 NOTE — Telephone Encounter (Signed)
Spoke with about medication problem. Patient stated that she is currently taking tramadol r/t gout the tramadol does not seem to helping with the pain she is experiencing. Patient also stated that she spoke with her pharmacist and he recommending she try Tapentadol (nucynta) if PCP thinks its a good idea. Patient is requesting to get a Rx for the tapentadol and D/C the tramadol Rx. Please advise.

## 2014-04-16 NOTE — Telephone Encounter (Signed)
Per Raquel, new Rx for tapentadol was written and signed. Rx given to patient today while she was in the office with her sister.

## 2014-05-07 LAB — HM MAMMOGRAPHY

## 2014-05-11 ENCOUNTER — Encounter: Payer: Self-pay | Admitting: Adult Health

## 2014-05-15 ENCOUNTER — Telehealth: Payer: Self-pay | Admitting: Adult Health

## 2014-05-15 NOTE — Telephone Encounter (Signed)
[facility] called and stated patient had diagnostic mammogram and was called by them to advise they needed additional images patient refused stating they could get results from these images. [facility] wanted to make primary aware and see if PCP could talk with patient and advise patient how important additional images are. [facility] stated they will send report on films they have but will be incomplete.

## 2014-05-15 NOTE — Telephone Encounter (Signed)
Please call pt to advise that [facility] called stating they needed additional films for completing mammogram. It is strongly recommended that she go for the additional films so that these areas in question may be evaluated.

## 2014-05-16 NOTE — Telephone Encounter (Signed)
Spoke to patient to notify her of Raquel's comments. Patient verbalized understanding. Patient was very upset about the care she received and stated she wouldn't go back there for a million dollars. I explained to the patient how important this was and patient agreed to go back one more time. Patient asked me to call [facility] for her to schedule the appt. I called and got an appt set up for 05/22/14 at 2:30pm. Patient was notified of appt.

## 2014-05-16 NOTE — Telephone Encounter (Signed)
Please read last message FYI

## 2014-05-18 NOTE — Telephone Encounter (Signed)
Good job, Dispensing opticianTonika. We can send her to another place to get her mammogram from here on out but since she had her mammogram done there and they wanted additional films it would have cost her more money to start at another place.

## 2014-06-05 ENCOUNTER — Encounter: Payer: Self-pay | Admitting: Internal Medicine

## 2014-07-22 ENCOUNTER — Inpatient Hospital Stay: Payer: Self-pay | Admitting: Internal Medicine

## 2014-07-22 LAB — BASIC METABOLIC PANEL
Anion Gap: 6 — ABNORMAL LOW (ref 7–16)
BUN: 19 mg/dL — AB (ref 7–18)
CHLORIDE: 108 mmol/L — AB (ref 98–107)
CO2: 25 mmol/L (ref 21–32)
Calcium, Total: 8.6 mg/dL (ref 8.5–10.1)
Creatinine: 1.78 mg/dL — ABNORMAL HIGH (ref 0.60–1.30)
EGFR (African American): 36 — ABNORMAL LOW
GFR CALC NON AF AMER: 30 — AB
Glucose: 140 mg/dL — ABNORMAL HIGH (ref 65–99)
Osmolality: 282 (ref 275–301)
Potassium: 3.6 mmol/L (ref 3.5–5.1)
Sodium: 139 mmol/L (ref 136–145)

## 2014-07-22 LAB — CK TOTAL AND CKMB (NOT AT ARMC)
CK, Total: 45 U/L
CK-MB: 1.4 ng/mL (ref 0.5–3.6)

## 2014-07-22 LAB — TROPONIN I
Troponin-I: 0.1 ng/mL — ABNORMAL HIGH
Troponin-I: 0.1 ng/mL — ABNORMAL HIGH

## 2014-07-22 LAB — CBC
HCT: 31.7 % — AB (ref 35.0–47.0)
HGB: 10 g/dL — AB (ref 12.0–16.0)
MCH: 29 pg (ref 26.0–34.0)
MCHC: 31.7 g/dL — AB (ref 32.0–36.0)
MCV: 92 fL (ref 80–100)
Platelet: 303 10*3/uL (ref 150–440)
RBC: 3.46 10*6/uL — AB (ref 3.80–5.20)
RDW: 15.8 % — AB (ref 11.5–14.5)
WBC: 8.7 10*3/uL (ref 3.6–11.0)

## 2014-07-23 DIAGNOSIS — R079 Chest pain, unspecified: Secondary | ICD-10-CM

## 2014-07-23 LAB — BASIC METABOLIC PANEL
Anion Gap: 8 (ref 7–16)
BUN: 24 mg/dL — ABNORMAL HIGH (ref 7–18)
CREATININE: 2.18 mg/dL — AB (ref 0.60–1.30)
Calcium, Total: 8.1 mg/dL — ABNORMAL LOW (ref 8.5–10.1)
Chloride: 105 mmol/L (ref 98–107)
Co2: 25 mmol/L (ref 21–32)
EGFR (African American): 29 — ABNORMAL LOW
EGFR (Non-African Amer.): 24 — ABNORMAL LOW
GLUCOSE: 96 mg/dL (ref 65–99)
Osmolality: 280 (ref 275–301)
Potassium: 3.7 mmol/L (ref 3.5–5.1)
Sodium: 138 mmol/L (ref 136–145)

## 2014-07-23 LAB — CBC WITH DIFFERENTIAL/PLATELET
BASOS PCT: 0.5 %
Basophil #: 0 10*3/uL (ref 0.0–0.1)
EOS ABS: 0.1 10*3/uL (ref 0.0–0.7)
EOS PCT: 1.8 %
HCT: 30.8 % — ABNORMAL LOW (ref 35.0–47.0)
HGB: 10.1 g/dL — ABNORMAL LOW (ref 12.0–16.0)
Lymphocyte #: 2 10*3/uL (ref 1.0–3.6)
Lymphocyte %: 27.8 %
MCH: 30 pg (ref 26.0–34.0)
MCHC: 32.8 g/dL (ref 32.0–36.0)
MCV: 92 fL (ref 80–100)
MONOS PCT: 8.5 %
Monocyte #: 0.6 x10 3/mm (ref 0.2–0.9)
Neutrophil #: 4.3 10*3/uL (ref 1.4–6.5)
Neutrophil %: 61.4 %
Platelet: 304 10*3/uL (ref 150–440)
RBC: 3.37 10*6/uL — ABNORMAL LOW (ref 3.80–5.20)
RDW: 15.9 % — ABNORMAL HIGH (ref 11.5–14.5)
WBC: 7.1 10*3/uL (ref 3.6–11.0)

## 2014-07-23 LAB — CK TOTAL AND CKMB (NOT AT ARMC)
CK, Total: 49 U/L
CK-MB: 1.6 ng/mL (ref 0.5–3.6)

## 2014-07-23 LAB — LIPID PANEL
Cholesterol: 176 mg/dL (ref 0–200)
HDL Cholesterol: 52 mg/dL (ref 40–60)
Ldl Cholesterol, Calc: 112 mg/dL — ABNORMAL HIGH (ref 0–100)
TRIGLYCERIDES: 62 mg/dL (ref 0–200)
VLDL Cholesterol, Calc: 12 mg/dL (ref 5–40)

## 2014-07-23 LAB — TROPONIN I: Troponin-I: 0.1 ng/mL — ABNORMAL HIGH

## 2014-07-24 LAB — BASIC METABOLIC PANEL
ANION GAP: 2 — AB (ref 7–16)
BUN: 21 mg/dL — AB (ref 7–18)
CO2: 24 mmol/L (ref 21–32)
Calcium, Total: 8.4 mg/dL — ABNORMAL LOW (ref 8.5–10.1)
Chloride: 111 mmol/L — ABNORMAL HIGH (ref 98–107)
Creatinine: 1.67 mg/dL — ABNORMAL HIGH (ref 0.60–1.30)
EGFR (Non-African Amer.): 32 — ABNORMAL LOW
GFR CALC AF AMER: 39 — AB
Glucose: 86 mg/dL (ref 65–99)
Osmolality: 276 (ref 275–301)
Potassium: 4.1 mmol/L (ref 3.5–5.1)
Sodium: 137 mmol/L (ref 136–145)

## 2014-07-24 LAB — PLATELET COUNT: PLATELETS: 289 10*3/uL (ref 150–440)

## 2014-07-24 LAB — PROTIME-INR
INR: 1.1
PROTHROMBIN TIME: 14 s (ref 11.5–14.7)

## 2014-07-25 LAB — CK: CK, TOTAL: 47 U/L

## 2014-07-26 LAB — BASIC METABOLIC PANEL
Anion Gap: 7 (ref 7–16)
BUN: 15 mg/dL (ref 7–18)
CALCIUM: 8.2 mg/dL — AB (ref 8.5–10.1)
CREATININE: 1.36 mg/dL — AB (ref 0.60–1.30)
Chloride: 106 mmol/L (ref 98–107)
Co2: 27 mmol/L (ref 21–32)
EGFR (African American): 50 — ABNORMAL LOW
GFR CALC NON AF AMER: 41 — AB
Glucose: 70 mg/dL (ref 65–99)
Osmolality: 279 (ref 275–301)
POTASSIUM: 3.6 mmol/L (ref 3.5–5.1)
Sodium: 140 mmol/L (ref 136–145)

## 2014-09-12 ENCOUNTER — Other Ambulatory Visit: Payer: Self-pay | Admitting: Internal Medicine

## 2014-09-13 ENCOUNTER — Encounter: Payer: Self-pay | Admitting: Internal Medicine

## 2014-09-28 ENCOUNTER — Telehealth: Payer: Self-pay | Admitting: Internal Medicine

## 2014-09-28 NOTE — Telephone Encounter (Signed)
Ms. Laura Duran called saying she takes Oxycodone for Gout pain in her hands but it makes her sick on her stomach. She spoke to a Pharmacist who recommended she take Hydrocone instead. She's wondering if this or something else can be prescribed for her that won't make her sick. She's in a lot of pain today. Please call the pt. She uses [facility] (ph# [phone]). Pt ph# [phone] Thank you.

## 2014-09-28 NOTE — Telephone Encounter (Signed)
Called patient no answer, unable to leave voicemail.

## 2014-09-28 NOTE — Telephone Encounter (Signed)
[facility] [employer] [facility] patient: Patient stated that in past when she had gout she took oxycodone for the pain but it caused an upset stomach. Patient is requesting a Rx for Hydrocodone instead to bypass that side effect. There is no record of Laura Duran ever prescribing oxycodone to this patient for gout in the past. Only pain meds I see on her med list are tramadol and nucyntia. Please advise

## 2014-09-28 NOTE — Telephone Encounter (Signed)
I WILL NOT PRESCRIBE  NARCOTICS TO A PATIENT I HAVE NOT SEEN .YOU CAN OFFER HER A FAST TRACK APPT TODAY WITH ME

## 2014-09-28 NOTE — Telephone Encounter (Signed)
Please add patient to Laura DeanCarrie Doss schedule on Monday at 2:30pm as a fast track appt for gout/medication change. Paitent aware of date and time of appt.

## 2014-10-01 ENCOUNTER — Encounter: Payer: Self-pay | Admitting: Nurse Practitioner

## 2014-10-01 ENCOUNTER — Ambulatory Visit (INDEPENDENT_AMBULATORY_CARE_PROVIDER_SITE_OTHER): Payer: Medicare Other | Admitting: Nurse Practitioner

## 2014-10-01 VITALS — BP 140/58 | HR 62 | Temp 97.7°F | Resp 14 | Ht 62.0 in | Wt 208.1 lb

## 2014-10-01 DIAGNOSIS — R05 Cough: Secondary | ICD-10-CM

## 2014-10-01 DIAGNOSIS — M549 Dorsalgia, unspecified: Secondary | ICD-10-CM

## 2014-10-01 DIAGNOSIS — M109 Gout, unspecified: Secondary | ICD-10-CM | POA: Insufficient documentation

## 2014-10-01 DIAGNOSIS — R059 Cough, unspecified: Secondary | ICD-10-CM

## 2014-10-01 MED ORDER — TRAMADOL HCL 50 MG PO TABS: 50.0000 mg | ORAL_TABLET | Freq: Two times a day (BID) | ORAL | Status: AC | PRN

## 2014-10-01 NOTE — Patient Instructions (Addendum)
Please call us if you can not tolerate the medication prescribed.   We will follow up in 2 weeks.

## 2014-10-01 NOTE — Assessment & Plan Note (Signed)
Non-productive cough. Improved. Pt will continue to use OTC medications. No wheezing noted today. Fu in 2 weeks

## 2014-10-01 NOTE — Progress Notes (Signed)
Subjective:    Patient ID: Laura Duran, female    DOB: 11-Feb-1946, 68 y.o.   MRN: [id number]  HPI  Laura Duran is a 68 yo female with a CC of medication management and cough with wheezing.  1) Medication- Hydrocodone- APAP 5-325 mg causes N/V after Cardiac stent was placed. Stopped taking in October. Made her vomit again yesterday when she took for back pain.   2) Gout in hands, watching foods that cause gout. Diagnosed with Gout over a year ago. Not taking maintenance meds.   3) Lower back pain hurts x 2 days ago. Midline, hurts worse with twisting, unsure of why it started, Lying down is better.  Not tried anything for the pain.   4) Cough/wheezing/hoarseness- x 2 weeks. Called EMS after feeling arrhythmia. Non-productive cough now.   Concerned with pain today. Just wants to focus on pain.   Review of Systems  Constitutional: Negative for fever, chills, diaphoresis, fatigue and unexpected weight change.  HENT: Positive for voice change. Negative for trouble swallowing.        Hoarse  Eyes: Negative for visual disturbance.  Respiratory: Positive for cough and wheezing. Negative for chest tightness and shortness of breath.   Cardiovascular: Negative for chest pain, palpitations and leg swelling.  Gastrointestinal: Positive for vomiting. Negative for nausea and diarrhea.  Musculoskeletal: Positive for back pain. Negative for gait problem.       Lower back midline tenderness, right at sacral area.  Skin: Negative for rash.  Neurological: Negative for dizziness and headaches.       Denies radiation of pain, saddle anaesthesia, loss of bowel/bladder, weakness.    Past Medical History  Diagnosis Date  . Arthritis   . Hypertension   . Hyperlipidemia   . Gout   . Heart attack 1997, 2001    Dr. Juliann Paresallwood every 6 month  . Angina at rest   . CAD (coronary artery disease)   . Paroxysmal atrial fibrillation   . Systolic murmur     Left sternal border  . PVD (peripheral vascular  disease)   . Chronic renal insufficiency   . Bilateral carotid bruits   . CHF (congestive heart failure)     history of  . Colon polyps   . Obesity     History   Social History  . Marital Status: Married    Spouse Name: N/A    Number of Children: 1  . Years of Education: 14   Occupational History  . Control Analyst     Retired   Social History Main Topics  . Smoking status: Former Games developermoker  . Smokeless tobacco: Never Used  . Alcohol Use: No  . Drug Use: No  . Sexual Activity: Not on file   Other Topics Concern  . Not on file   Social History Narrative   Laura Duran grew up in [place], [place]. She is married to Laura Duran. They have 1 son Laura Duran(Bernard), 2 granddaughters Laura Duran(Gabrielle, Laura Duran) and 1 grandson Laura Duran(Zachary). Laura Duran is retired. She enjoys singing. She sings in the church choir. She also enjoyed her cowboy movies.    Past Surgical History  Procedure Laterality Date  . Coronary artery bypass graft  1997    x 4. [facility]  . 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 [facility]    Family History  Problem Relation Age of Onset  . Diabetes Mother   . Kidney disease Father     Kidney tumor  . Hypertension Sister  Allergies  Allergen Reactions  . Vicodin [Hydrocodone-Acetaminophen]     Patient states, It makes me feel uncomfortable and takes me away from myself.    Current Outpatient Prescriptions on File Prior to Visit  Medication Sig Dispense Refill  . ALPRAZolam (XANAX) 0.5 MG tablet TAKE 1 TABLET BY MOUTH EVERY 12 HOURS AS NEEDED 60 tablet 4  . aspirin 81 MG tablet Take 81 mg by mouth daily.    . carvedilol (COREG) 25 MG tablet Take 1 tablet (25 mg total) by mouth 2 (two) times daily. 180 tablet 1  . folic acid (FOLVITE) 1 MG tablet TAKE 1 TABLET BY MOUTH EVERY DAY 30 tablet 6  . FOLIC ACID PO Take 1 mg by mouth daily.    . furosemide (LASIX) 20 MG tablet Take 1 tablet (20 mg total) by mouth daily. Once a day as needed for edema 90 tablet 1  . hydrALAZINE  (APRESOLINE) 25 MG tablet Take 1 tablet (25 mg total) by mouth 3 (three) times daily. 270 tablet 1  . isosorbide mononitrate (IMDUR) 60 MG 24 hr tablet TAKE 1 TABLET BY MOUTH TWICE A DAY 60 tablet 1  . losartan (COZAAR) 50 MG tablet Take 1 tablet (50 mg total) by mouth daily. 90 tablet 1  . pravastatin (PRAVACHOL) 20 MG tablet Take 1 tablet (20 mg total) by mouth daily. 90 tablet 1  . ranolazine (RANEXA) 500 MG 12 hr tablet Take 1 tablet (500 mg total) by mouth 2 (two) times daily. 180 tablet 1   No current facility-administered medications on file prior to visit.         Objective:   Physical Exam  Constitutional: She is oriented to person, place, and time. She appears well-developed and well-nourished. No distress.  Obese  HENT:  Head: Normocephalic and atraumatic.  Eyes: Conjunctivae and EOM are normal. Pupils are equal, round, and reactive to light. Right eye exhibits no discharge. Left eye exhibits no discharge. No scleral icterus.  Neck: Normal range of motion.  Cardiovascular: Normal rate and regular rhythm.   Pulmonary/Chest: Effort normal and breath sounds normal.  Musculoskeletal: She exhibits tenderness. She exhibits no edema.  Low midline back tenderness to palpation, paraspinal muscles are not tender, no CVA tenderness elicited. Heel and toe walking difficult because of left leg amputation below knee.   Neurological: She is alert and oriented to person, place, and time. She displays normal reflexes. No cranial nerve deficit. She exhibits normal muscle tone. Coordination normal.  Left Leg below knee amputation.   Skin: Skin is warm and dry. No rash noted. She is not diaphoretic.  Psychiatric: She has a normal mood and affect. Her behavior is normal. Judgment and thought content normal.    BP 140/58 mmHg  Pulse 62  Temp(Src) 97.7 F (36.5 C) (Oral)  Resp 14  Ht 5\' 2"  (1.575 m)  Wt 208 lb 1.9 oz (94.403 kg)  BMI 38.06 kg/m2  SpO2 97%     Assessment & Plan:

## 2014-10-01 NOTE — Assessment & Plan Note (Signed)
New onset. Pt will try Tramadol with her permission. Pt is aware it might cause nausea/vomiting, same as Vicodin. She is at the renal dose (Rx for Tramadol 50 mg twice daily for pain.) She will FU in 2 weeks.

## 2014-10-01 NOTE — Progress Notes (Signed)
Pre visit review using our clinic review tool, if applicable. No additional management support is needed unless otherwise documented below in the visit note. 

## 2014-10-09 ENCOUNTER — Other Ambulatory Visit: Payer: Self-pay

## 2014-10-09 MED ORDER — PRAVASTATIN SODIUM 20 MG PO TABS: 20.0000 mg | ORAL_TABLET | Freq: Every day | ORAL | Status: AC

## 2014-10-09 MED ORDER — FUROSEMIDE 20 MG PO TABS: 20.0000 mg | ORAL_TABLET | Freq: Every day | ORAL | Status: DC

## 2014-10-09 MED ORDER — HYDRALAZINE HCL 25 MG PO TABS: 25.0000 mg | ORAL_TABLET | Freq: Three times a day (TID) | ORAL | Status: DC

## 2014-10-10 ENCOUNTER — Other Ambulatory Visit: Payer: Self-pay | Admitting: *Deleted

## 2014-10-10 MED ORDER — RANOLAZINE ER 500 MG PO TB12: 500.0000 mg | ORAL_TABLET | Freq: Two times a day (BID) | ORAL | Status: DC

## 2014-10-12 ENCOUNTER — Encounter: Payer: Self-pay | Admitting: Internal Medicine

## 2014-10-22 ENCOUNTER — Other Ambulatory Visit: Payer: Self-pay | Admitting: *Deleted

## 2014-10-22 MED ORDER — ALPRAZOLAM 0.5 MG PO TABS: 0.5000 mg | ORAL_TABLET | Freq: Two times a day (BID) | ORAL | Status: DC | PRN

## 2014-10-22 NOTE — Telephone Encounter (Signed)
Ok refill? Last appt 10/01/14, has appt tomorrow

## 2014-10-22 NOTE — Telephone Encounter (Signed)
Called in refill with below directions.

## 2014-10-22 NOTE — Telephone Encounter (Signed)
Approve 1 month, no refills, #60 pills.

## 2014-10-23 ENCOUNTER — Ambulatory Visit: Payer: Medicare Other | Admitting: Nurse Practitioner

## 2014-10-30 ENCOUNTER — Encounter: Payer: Self-pay | Admitting: Nurse Practitioner

## 2014-10-30 ENCOUNTER — Ambulatory Visit (INDEPENDENT_AMBULATORY_CARE_PROVIDER_SITE_OTHER): Payer: Medicare Other | Admitting: Nurse Practitioner

## 2014-10-30 DIAGNOSIS — R82998 Other abnormal findings in urine: Secondary | ICD-10-CM

## 2014-10-30 DIAGNOSIS — N390 Urinary tract infection, site not specified: Secondary | ICD-10-CM

## 2014-10-30 DIAGNOSIS — R300 Dysuria: Secondary | ICD-10-CM

## 2014-10-30 NOTE — Progress Notes (Signed)
Pre visit review using our clinic review tool, if applicable. No additional management support is needed unless otherwise documented below in the visit note. 

## 2014-10-30 NOTE — Progress Notes (Signed)
   Subjective:    Patient ID: Laura Duran, female    DOB: 1946/05/19, 69 y.o.   MRN: [id number]  HPI Laura Duran is a 69 yo female with a CC of bubbly urine. She believes she has a UTI and would like to be tested.   1) Pt was asked to give sample. Pt states at home she goes every 5 minutes, but today she was unable to give a sample. She sat for approx. 1 hour and was given water. She complains of fatigue and cloudy bubbly urine. She has increased frequency and denies hematuria or urgency. She reports drinking cranberry juice and water. Pt was given unopened cup with instructions of how to obtain a sample to bring back when she can.   Review of Systems  Constitutional: Positive for fatigue. Negative for fever, chills and diaphoresis.  Gastrointestinal: Positive for nausea. Negative for vomiting and diarrhea.       Last night  Genitourinary: Positive for frequency. Negative for dysuria, urgency, hematuria and flank pain.  Skin: Negative for rash.      Objective:   Physical Exam  Constitutional: She is oriented to person, place, and time. She appears well-developed and well-nourished. No distress.  HENT:  Head: Normocephalic and atraumatic.  Right Ear: External ear normal.  Left Ear: External ear normal.  Cardiovascular: Normal rate, regular rhythm, normal heart sounds and intact distal pulses.  Exam reveals no gallop and no friction rub.   No murmur heard. Pulmonary/Chest: Effort normal and breath sounds normal. No respiratory distress. She has no wheezes. She has no rales. She exhibits no tenderness.  Neurological: She is alert and oriented to person, place, and time. No cranial nerve deficit. She exhibits normal muscle tone. Coordination normal.  Skin: Skin is warm and dry. No rash noted. She is not diaphoretic.  Psychiatric: She has a normal mood and affect. Her behavior is normal. Judgment and thought content normal.      Assessment & Plan:

## 2014-10-30 NOTE — Patient Instructions (Signed)
Please bring back the urine at your earliest convenience.   Make sure to not contaminate the top of the cup or lid.

## 2014-10-31 ENCOUNTER — Other Ambulatory Visit: Payer: Self-pay | Admitting: Nurse Practitioner

## 2014-10-31 ENCOUNTER — Ambulatory Visit (INDEPENDENT_AMBULATORY_CARE_PROVIDER_SITE_OTHER): Payer: Medicare Other | Admitting: Nurse Practitioner

## 2014-10-31 ENCOUNTER — Encounter: Payer: Self-pay | Admitting: Nurse Practitioner

## 2014-10-31 VITALS — BP 142/62 | HR 69 | Temp 97.7°F | Resp 14 | Ht 62.0 in | Wt 205.0 lb

## 2014-10-31 DIAGNOSIS — R5383 Other fatigue: Secondary | ICD-10-CM

## 2014-10-31 DIAGNOSIS — R82998 Other abnormal findings in urine: Secondary | ICD-10-CM

## 2014-10-31 LAB — POCT URINALYSIS DIPSTICK
BILIRUBIN UA: NEGATIVE
Blood, UA: NEGATIVE
GLUCOSE UA: NEGATIVE
Ketones, UA: NEGATIVE
Nitrite, UA: NEGATIVE
Protein, UA: 100
Spec Grav, UA: 1.015
UROBILINOGEN UA: 0.2
pH, UA: 5

## 2014-10-31 NOTE — Patient Instructions (Signed)
We will contact you about your lab results.   Lets follow up in 1 week.

## 2014-10-31 NOTE — Progress Notes (Signed)
Subjective:    Patient ID: Laura Duran, female    DOB: September 14, 1946, 69 y.o.   MRN: [id number]  HPI  Ms. Laura Duran returns today after bringing back a urine sample. She looks increasingly fatigued and complains of feeling worse, since I had an opening she was placed on the schedule and promptly seen within 30 min.   1) More tired today than yesterday. Fatigue x 2 days. She reports no energy. No appetite. She complains of still having bubbly urine. She is unsure of what is going on and requests blood be drawn to "find out what is wrong with her". She reports not trying anything at home.   Review of Systems  Constitutional: Positive for appetite change and fatigue. Negative for fever, chills, diaphoresis and unexpected weight change.       No appetite or energy   Eyes: Negative for visual disturbance.  Respiratory: Negative for cough, chest tightness, shortness of breath and wheezing.   Cardiovascular: Negative for chest pain, palpitations and leg swelling.  Gastrointestinal: Negative for nausea, vomiting, abdominal pain, diarrhea, constipation, blood in stool and abdominal distention.  Endocrine: Positive for polyuria. Negative for cold intolerance, heat intolerance, polydipsia and polyphagia.  Genitourinary: Positive for frequency and difficulty urinating. Negative for dysuria, urgency, hematuria, flank pain and decreased urine volume.  Musculoskeletal: Negative for back pain, neck pain and neck stiffness.  Skin: Negative for color change, rash and wound.  Neurological: Positive for weakness. Negative for dizziness, numbness and headaches.  Hematological: Does not bruise/bleed easily.  Psychiatric/Behavioral: Negative for suicidal ideas. The patient is not nervous/anxious.    Past Medical History  Diagnosis Date  . Arthritis   . Hypertension   . Hyperlipidemia   . Gout   . Heart attack 1997, 2001    Dr. Juliann Paresallwood every 6 month  . Angina at rest   . CAD (coronary artery disease)     . Paroxysmal atrial fibrillation   . Systolic murmur     Left sternal border  . PVD (peripheral vascular disease)   . Chronic renal insufficiency   . Bilateral carotid bruits   . CHF (congestive heart failure)     history of  . Colon polyps   . Obesity     History   Social History  . Marital Status: Married    Spouse Name: N/A    Number of Children: 1  . Years of Education: 14   Occupational History  . Control Analyst     Retired   Social History Main Topics  . Smoking status: Former Games developermoker  . Smokeless tobacco: Never Used  . Alcohol Use: No  . Drug Use: No  . Sexual Activity: Not on file   Other Topics Concern  . Not on file   Social History Narrative   Laura Duran grew up in [place], [place]. She is married to [place]. They have 1 son [name](Laura Duran), 2 granddaughters Laura Duran(Laura Duran, DoverMadalyn) and 1 grandson [name](Laura Duran). Laura Duran is retired. She enjoys singing. She sings in the church choir. She also enjoyed her cowboy movies.    Past Surgical History  Procedure Laterality Date  . Coronary artery bypass graft  1997    x 4. [facility]  . 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 [facility]    Family History  Problem Relation Age of Onset  . Diabetes Mother   . Kidney disease Father     Kidney tumor  . Hypertension Sister     Allergies  Allergen Reactions  .  Vicodin [Hydrocodone-Acetaminophen]     Patient states, It makes me feel uncomfortable and takes me away from myself.    Current Outpatient Prescriptions on File Prior to Visit  Medication Sig Dispense Refill  . ALPRAZolam (XANAX) 0.5 MG tablet Take 1 tablet (0.5 mg total) by mouth every 12 (twelve) hours as needed. 60 tablet 4  . amiodarone (PACERONE) 200 MG tablet Take 200 mg by mouth daily.    Marland Kitchen apixaban (ELIQUIS) 5 MG TABS tablet Take 5 mg by mouth 2 (two) times daily.    Marland Kitchen aspirin 81 MG tablet Take 81 mg by mouth daily.    . carvedilol (COREG) 25 MG tablet Take 1 tablet (25 mg total) by mouth 2 (two) times daily.  180 tablet 1  . folic acid (FOLVITE) 1 MG tablet TAKE 1 TABLET BY MOUTH EVERY DAY 30 tablet 6  . FOLIC ACID PO Take 1 mg by mouth daily.    . furosemide (LASIX) 20 MG tablet Take 1 tablet (20 mg total) by mouth daily. Once a day as needed for edema 90 tablet 1  . hydrALAZINE (APRESOLINE) 25 MG tablet Take 1 tablet (25 mg total) by mouth 3 (three) times daily. 270 tablet 1  . isosorbide mononitrate (IMDUR) 60 MG 24 hr tablet TAKE 1 TABLET BY MOUTH TWICE A DAY 60 tablet 1  . losartan (COZAAR) 50 MG tablet Take 1 tablet (50 mg total) by mouth daily. 90 tablet 1  . pravastatin (PRAVACHOL) 20 MG tablet Take 1 tablet (20 mg total) by mouth daily. 90 tablet 1  . ranolazine (RANEXA) 500 MG 12 hr tablet Take 1 tablet (500 mg total) by mouth 2 (two) times daily. 60 tablet 3  . traMADol (ULTRAM) 50 MG tablet Take 1 tablet (50 mg total) by mouth every 12 (twelve) hours as needed. 60 tablet 0   No current facility-administered medications on file prior to visit.      Objective:   Physical Exam  Constitutional: She is oriented to person, place, and time. She appears well-developed and well-nourished. No distress.  BP 142/62 mmHg  Pulse 69  Temp(Src) 97.7 F (36.5 C) (Oral)  Resp 14  Ht  (1.575 m)  Wt 205 lb (92.987 kg)  BMI 37.49 kg/m2  SpO2 98%   HENT:  Head: Normocephalic and atraumatic.  Right Ear: External ear normal.  Left Ear: External ear normal.  Neck: Normal range of motion. Neck supple. No thyromegaly present.  Cardiovascular: Normal rate and regular rhythm.   Pulmonary/Chest: Effort normal and breath sounds normal.  Musculoskeletal: Normal range of motion. She exhibits no edema or tenderness.  Left leg amputated  Lymphadenopathy:    She has no cervical adenopathy.  Neurological: She is alert and oriented to person, place, and time.  Skin: Skin is warm and dry. She is not diaphoretic.  Psychiatric: She has a normal mood and affect. Her behavior is normal. Judgment and thought  content normal.      Assessment & Plan:

## 2014-10-31 NOTE — Progress Notes (Signed)
Pre visit review using our clinic review tool, if applicable. No additional management support is needed unless otherwise documented below in the visit note. 

## 2014-11-01 ENCOUNTER — Telehealth: Payer: Self-pay | Admitting: *Deleted

## 2014-11-01 ENCOUNTER — Inpatient Hospital Stay: Payer: Self-pay | Admitting: Internal Medicine

## 2014-11-01 LAB — COMPREHENSIVE METABOLIC PANEL
ANION GAP: 9 (ref 7–16)
Albumin: 3.2 g/dL — ABNORMAL LOW (ref 3.4–5.0)
Alkaline Phosphatase: 94 U/L
BILIRUBIN TOTAL: 0.2 mg/dL (ref 0.2–1.0)
BUN: 38 mg/dL — ABNORMAL HIGH (ref 7–18)
CALCIUM: 9.1 mg/dL (ref 8.5–10.1)
CO2: 23 mmol/L (ref 21–32)
CREATININE: 1.93 mg/dL — AB (ref 0.60–1.30)
Chloride: 106 mmol/L (ref 98–107)
EGFR (Non-African Amer.): 27 — ABNORMAL LOW
GFR CALC AF AMER: 33 — AB
Glucose: 66 mg/dL (ref 65–99)
Osmolality: 283 (ref 275–301)
POTASSIUM: 4.3 mmol/L (ref 3.5–5.1)
SGOT(AST): 18 U/L (ref 15–37)
SGPT (ALT): 19 U/L
SODIUM: 138 mmol/L (ref 136–145)
Total Protein: 6.5 g/dL (ref 6.4–8.2)

## 2014-11-01 LAB — CBC WITH DIFFERENTIAL/PLATELET
BASOS PCT: 0.8 % (ref 0.0–3.0)
Basophils Absolute: 0 10*3/uL (ref 0.0–0.1)
Eosinophils Absolute: 0.3 10*3/uL (ref 0.0–0.7)
Eosinophils Relative: 4.5 % (ref 0.0–5.0)
HCT: 24 % — ABNORMAL LOW (ref 36.0–46.0)
HEMOGLOBIN: 7.9 g/dL — AB (ref 12.0–15.0)
LYMPHS PCT: 22.6 % (ref 12.0–46.0)
Lymphs Abs: 1.5 10*3/uL (ref 0.7–4.0)
MCHC: 33 g/dL (ref 30.0–36.0)
MCV: 85.7 fl (ref 78.0–100.0)
MONOS PCT: 5.4 % (ref 3.0–12.0)
Monocytes Absolute: 0.4 10*3/uL (ref 0.1–1.0)
Neutro Abs: 4.4 10*3/uL (ref 1.4–7.7)
Neutrophils Relative %: 66.7 % (ref 43.0–77.0)
Platelets: 333 10*3/uL (ref 150.0–400.0)
RBC: 2.8 Mil/uL — ABNORMAL LOW (ref 3.87–5.11)
RDW: 18.4 % — AB (ref 11.5–15.5)
WBC: 6.6 10*3/uL (ref 4.0–10.5)

## 2014-11-01 LAB — COMPREHENSIVE METABOLIC PANEL WITH GFR
ALT: 11 U/L (ref 0–35)
AST: 12 U/L (ref 0–37)
Albumin: 3.5 g/dL (ref 3.5–5.2)
Alkaline Phosphatase: 83 U/L (ref 39–117)
BUN: 41 mg/dL — ABNORMAL HIGH (ref 6–23)
CO2: 20 meq/L (ref 19–32)
Calcium: 9.3 mg/dL (ref 8.4–10.5)
Chloride: 110 meq/L (ref 96–112)
Creatinine, Ser: 1.82 mg/dL — ABNORMAL HIGH (ref 0.40–1.20)
GFR: 35.37 mL/min — ABNORMAL LOW
Glucose, Bld: 100 mg/dL — ABNORMAL HIGH (ref 70–99)
Potassium: 4.5 meq/L (ref 3.5–5.1)
Sodium: 139 meq/L (ref 135–145)
Total Bilirubin: 0.3 mg/dL (ref 0.2–1.2)
Total Protein: 6.3 g/dL (ref 6.0–8.3)

## 2014-11-01 LAB — CK TOTAL AND CKMB (NOT AT ARMC)
CK, TOTAL: 38 U/L (ref 26–192)
CK, Total: 44 U/L (ref 26–192)
CK-MB: 1.2 ng/mL (ref 0.5–3.6)
CK-MB: 1.1 ng/mL (ref 0.5–3.6)

## 2014-11-01 LAB — TSH: TSH: 3.77 u[IU]/mL (ref 0.35–4.50)

## 2014-11-01 LAB — APTT: Activated PTT: 41.1 secs — ABNORMAL HIGH (ref 23.6–35.9)

## 2014-11-01 LAB — CBC
HCT: 22.7 % — ABNORMAL LOW (ref 35.0–47.0)
HGB: 7.2 g/dL — ABNORMAL LOW (ref 12.0–16.0)
MCH: 28.1 pg (ref 26.0–34.0)
MCHC: 31.8 g/dL — ABNORMAL LOW (ref 32.0–36.0)
MCV: 88 fL (ref 80–100)
PLATELETS: 319 10*3/uL (ref 150–440)
RBC: 2.57 10*6/uL — ABNORMAL LOW (ref 3.80–5.20)
RDW: 17.6 % — ABNORMAL HIGH (ref 11.5–14.5)
WBC: 6.6 10*3/uL (ref 3.6–11.0)

## 2014-11-01 LAB — PROTIME-INR
INR: 1.9
Prothrombin Time: 21 secs — ABNORMAL HIGH (ref 11.5–14.7)

## 2014-11-01 LAB — TROPONIN I
TROPONIN-I: 0.07 ng/mL — AB
TROPONIN-I: 0.08 ng/mL — AB

## 2014-11-01 NOTE — Telephone Encounter (Signed)
Please inform Laura Duran to go to the ED for a critically low hemoglobin. She may need to be transfused. Thanks!

## 2014-11-01 NOTE — Telephone Encounter (Signed)
Clydie BraunKaren called to report a critical value. 7.9 Hemoglobin, 24.0 Hematocrit

## 2014-11-01 NOTE — Telephone Encounter (Signed)
Called patient and advised to go to ER and patient voiced understanding and having husband drive. Faxed OV notes with lab values to ER and notified Susa GriffinsJean Charge Nurse in ER at [facility].

## 2014-11-02 DIAGNOSIS — R82998 Other abnormal findings in urine: Secondary | ICD-10-CM | POA: Insufficient documentation

## 2014-11-02 DIAGNOSIS — R5383 Other fatigue: Secondary | ICD-10-CM | POA: Insufficient documentation

## 2014-11-02 LAB — CBC WITH DIFFERENTIAL/PLATELET
BASOS PCT: 0.6 %
Basophil #: 0 10*3/uL (ref 0.0–0.1)
Eosinophil #: 0.3 10*3/uL (ref 0.0–0.7)
Eosinophil %: 5.6 %
HCT: 30.5 % — AB (ref 35.0–47.0)
HGB: 9.8 g/dL — AB (ref 12.0–16.0)
Lymphocyte #: 1.5 10*3/uL (ref 1.0–3.6)
Lymphocyte %: 25.8 %
MCH: 28.8 pg (ref 26.0–34.0)
MCHC: 32.2 g/dL (ref 32.0–36.0)
MCV: 89 fL (ref 80–100)
Monocyte #: 0.5 x10 3/mm (ref 0.2–0.9)
Monocyte %: 9.3 %
NEUTROS ABS: 3.4 10*3/uL (ref 1.4–6.5)
NEUTROS PCT: 58.7 %
Platelet: 284 10*3/uL (ref 150–440)
RBC: 3.41 10*6/uL — ABNORMAL LOW (ref 3.80–5.20)
RDW: 16.1 % — ABNORMAL HIGH (ref 11.5–14.5)
WBC: 5.8 10*3/uL (ref 3.6–11.0)

## 2014-11-02 LAB — CK TOTAL AND CKMB (NOT AT ARMC)
CK, TOTAL: 42 U/L (ref 26–192)
CK-MB: 0.8 ng/mL (ref 0.5–3.6)

## 2014-11-02 LAB — BASIC METABOLIC PANEL
Anion Gap: 7 (ref 7–16)
BUN: 35 mg/dL — ABNORMAL HIGH (ref 7–18)
CALCIUM: 8.8 mg/dL (ref 8.5–10.1)
Chloride: 111 mmol/L — ABNORMAL HIGH (ref 98–107)
Co2: 23 mmol/L (ref 21–32)
Creatinine: 1.79 mg/dL — ABNORMAL HIGH (ref 0.60–1.30)
EGFR (African American): 36 — ABNORMAL LOW
GFR CALC NON AF AMER: 30 — AB
Glucose: 84 mg/dL (ref 65–99)
Osmolality: 288 (ref 275–301)
POTASSIUM: 4.2 mmol/L (ref 3.5–5.1)
Sodium: 141 mmol/L (ref 136–145)

## 2014-11-02 LAB — URINE CULTURE

## 2014-11-02 LAB — TROPONIN I: Troponin-I: 0.08 ng/mL — ABNORMAL HIGH

## 2014-11-02 LAB — HEMOGLOBIN: HGB: 10.1 g/dL — ABNORMAL LOW (ref 12.0–16.0)

## 2014-11-02 NOTE — Assessment & Plan Note (Signed)
Pt complains of severe fatigue. Worsening. Obtain CBC w/ diff, CMET, TSH. FU 1 week or sooner if lab results show deficiency.   * Update- Critical called in on her hemoglobin 7.9 also CMET shows worsening kidney function. Pt was sent to hospital by way of husband. Please see notes regarding this.

## 2014-11-02 NOTE — Assessment & Plan Note (Addendum)
Worsening. Pt POCT urine unable to obtain today. Came back next day with urine sample that revealed trace leukocytes and 100 protein. Sent for culture. Concerned about kidney function. Pt was then asked to make an appointment and was seen within 30 min. for weakness. See note 10/31/14.

## 2014-11-03 LAB — CBC WITH DIFFERENTIAL/PLATELET
Basophil #: 0 10*3/uL (ref 0.0–0.1)
Basophil %: 0.8 %
EOS ABS: 0.3 10*3/uL (ref 0.0–0.7)
Eosinophil %: 5.8 %
HCT: 30.8 % — AB (ref 35.0–47.0)
HGB: 9.8 g/dL — ABNORMAL LOW (ref 12.0–16.0)
Lymphocyte #: 1.2 10*3/uL (ref 1.0–3.6)
Lymphocyte %: 20.1 %
MCH: 28.2 pg (ref 26.0–34.0)
MCHC: 31.7 g/dL — AB (ref 32.0–36.0)
MCV: 89 fL (ref 80–100)
Monocyte #: 0.6 x10 3/mm (ref 0.2–0.9)
Monocyte %: 9.5 %
Neutrophil #: 3.7 10*3/uL (ref 1.4–6.5)
Neutrophil %: 63.8 %
Platelet: 294 10*3/uL (ref 150–440)
RBC: 3.46 10*6/uL — ABNORMAL LOW (ref 3.80–5.20)
RDW: 16.5 % — ABNORMAL HIGH (ref 11.5–14.5)
WBC: 5.8 10*3/uL (ref 3.6–11.0)

## 2014-11-04 LAB — HEMOGLOBIN: HGB: 10.2 g/dL — ABNORMAL LOW (ref 12.0–16.0)

## 2014-11-04 LAB — URINALYSIS, COMPLETE
BILIRUBIN, UR: NEGATIVE
Bacteria: NONE SEEN
Blood: NEGATIVE
Glucose,UR: NEGATIVE mg/dL (ref 0–75)
Ketone: NEGATIVE
Leukocyte Esterase: NEGATIVE
NITRITE: NEGATIVE
PH: 5 (ref 4.5–8.0)
RBC,UR: <1 /HPF (ref 0–5)
Specific Gravity: 1.014 (ref 1.003–1.030)
Squamous Epithelial: 2
WBC UR: <1 /HPF (ref 0–5)

## 2014-11-04 LAB — BASIC METABOLIC PANEL
Anion Gap: 10 (ref 7–16)
BUN: 29 mg/dL — ABNORMAL HIGH (ref 7–18)
CALCIUM: 8.7 mg/dL (ref 8.5–10.1)
CO2: 21 mmol/L (ref 21–32)
Chloride: 109 mmol/L — ABNORMAL HIGH (ref 98–107)
Creatinine: 2.21 mg/dL — ABNORMAL HIGH (ref 0.60–1.30)
EGFR (Non-African Amer.): 23 — ABNORMAL LOW
GFR CALC AF AMER: 28 — AB
Glucose: 80 mg/dL (ref 65–99)
OSMOLALITY: 284 (ref 275–301)
Potassium: 5.1 mmol/L (ref 3.5–5.1)
Sodium: 140 mmol/L (ref 136–145)

## 2014-11-06 ENCOUNTER — Other Ambulatory Visit: Payer: Self-pay | Admitting: Internal Medicine

## 2014-11-07 ENCOUNTER — Encounter: Payer: Self-pay | Admitting: Nurse Practitioner

## 2014-11-07 ENCOUNTER — Ambulatory Visit (INDEPENDENT_AMBULATORY_CARE_PROVIDER_SITE_OTHER): Payer: Medicare Other | Admitting: Nurse Practitioner

## 2014-11-07 VITALS — BP 158/72 | HR 64 | Temp 97.9°F | Resp 14 | Ht 62.0 in | Wt 206.0 lb

## 2014-11-07 DIAGNOSIS — R5383 Other fatigue: Secondary | ICD-10-CM

## 2014-11-07 DIAGNOSIS — Z09 Encounter for follow-up examination after completed treatment for conditions other than malignant neoplasm: Secondary | ICD-10-CM

## 2014-11-07 DIAGNOSIS — I10 Essential (primary) hypertension: Secondary | ICD-10-CM

## 2014-11-07 NOTE — Progress Notes (Signed)
   Subjective:    Patient ID: Laura Duran, female    DOB: 05-13-1946, 69 y.o.   MRN: [id number]  HPI  [name] is a 69 yo female following up from the hospital for rectal bleeding. She is also complaining of fatigue and wheezing.   1) found blood in stool. Liquid diet until Sunday. D/c Sunday 11/04/14, Monday 11/05/14 Dr. Juliann Paresallwood for Cardiology.  Blood transfused 2 pints of blood   Feb. Getting stress test and echocardiagram   Seeing Dr. Arvilla MarketMills at [facility] in 3/3 for gastroenterology   Drinking lots of water.  Review of Systems  Constitutional: Positive for fatigue. Negative for fever, chills and diaphoresis.  Respiratory: Positive for wheezing. Negative for chest tightness and shortness of breath.   Cardiovascular: Negative for chest pain, palpitations and leg swelling.  Gastrointestinal: Negative for nausea, vomiting, abdominal pain, diarrhea, blood in stool and abdominal distention.       Blood in stool has resolved. Following up with GI soon.  Skin: Negative for rash.  Neurological: Negative for dizziness, weakness, numbness and headaches.  Psychiatric/Behavioral: The patient is not nervous/anxious.      Objective:   Physical Exam  Constitutional: She is oriented to person, place, and time. She appears well-developed and well-nourished. No distress.  BP 158/72 mmHg  Pulse 64  Temp(Src) 97.9 F (36.6 C) (Oral)  Resp 14  Ht 5\' 2"  (1.575 m)  Wt 206 lb (93.441 kg)  BMI 37.67 kg/m2  SpO2 98%   HENT:  Head: Normocephalic and atraumatic.  Right Ear: External ear normal.  Left Ear: External ear normal.  Eyes: Conjunctivae and EOM are normal. Pupils are equal, round, and reactive to light. Right eye exhibits no discharge. Left eye exhibits no discharge. No scleral icterus.  Cardiovascular: Normal rate and regular rhythm.  Exam reveals no friction rub.   Pulmonary/Chest: Effort normal and breath sounds normal. No respiratory distress. She has no wheezes. She has no rales.  She exhibits no tenderness.  Abdominal: Soft. Bowel sounds are normal. She exhibits no distension and no mass. There is no tenderness. There is no rebound and no guarding.  Musculoskeletal: Normal range of motion. She exhibits no edema or tenderness.  Left lower leg amputated  Neurological: She is alert and oriented to person, place, and time. No cranial nerve deficit. She exhibits normal muscle tone. Coordination normal.  Skin: Skin is warm and dry. No rash noted. She is not diaphoretic.  Psychiatric: She has a normal mood and affect. Her behavior is normal. Judgment and thought content normal.      Assessment & Plan:

## 2014-11-07 NOTE — Progress Notes (Signed)
Pre visit review using our clinic review tool, if applicable. No additional management support is needed unless otherwise documented below in the visit note. 

## 2014-11-09 DIAGNOSIS — Z09 Encounter for follow-up examination after completed treatment for conditions other than malignant neoplasm: Secondary | ICD-10-CM | POA: Insufficient documentation

## 2014-11-09 NOTE — Assessment & Plan Note (Addendum)
Pt due to have stress testing and another Cardiology visit after d/c from the hospital. No change to medications at this time.

## 2014-11-09 NOTE — Assessment & Plan Note (Signed)
Stable. Pt still recovering from a GI bleed. Encouraged pt to rest, eat bland and nutritious food, stay hydrated until energy returns. FU with GI and Cardiology within the next weeks.

## 2014-11-09 NOTE — Patient Instructions (Signed)
Follow up with other providers as instructed.

## 2014-11-09 NOTE — Assessment & Plan Note (Addendum)
GI bleed. Transfused 2 units of blood. In feb pt is to get a stress test and echo. Gastroenterology with Dr. Arvilla MarketMills at [facility] on 3/3. Pt doing well. Encouraged rest, bland and nutritious diet, discussed when to call 911 if it comes back. FU prn.

## 2014-11-12 ENCOUNTER — Encounter: Payer: Self-pay | Admitting: Internal Medicine

## 2014-11-16 ENCOUNTER — Telehealth: Payer: Self-pay | Admitting: *Deleted

## 2014-11-16 NOTE — Telephone Encounter (Signed)
Pt called in, stating she needed to come in for labs right now because she is "having the same problem" as last time when her blood was low. Attempted to triage patient regarding symptoms, states "I'm bleeding". Pt states she is having rectal bleeding but would not answer any of my questions as to when it started, color, amount, how she is currently feeling. States I just needed to tell Lyla SonCarrie that she needed to come in now. Explained I was trying to triage whether it would be better for her to be seen in ED for symptoms rather than coming in for bloodwork and if she was symptomatic. Pt states she did not want to sit in the ED just for labs. Pt again said I just needed to tell Lyla SonCarrie and she would understand. Carrie notified, and recommended pt be seen in ED or UC. Notified pt, said "ok" then hung up.

## 2014-11-20 ENCOUNTER — Other Ambulatory Visit: Payer: Self-pay | Admitting: *Deleted

## 2014-11-20 MED ORDER — FOLIC ACID 1 MG PO TABS: 1.0000 mg | ORAL_TABLET | Freq: Every day | ORAL | Status: AC

## 2014-11-21 ENCOUNTER — Encounter: Payer: Self-pay | Admitting: Nurse Practitioner

## 2014-11-21 ENCOUNTER — Ambulatory Visit (INDEPENDENT_AMBULATORY_CARE_PROVIDER_SITE_OTHER): Payer: Medicare Other | Admitting: Nurse Practitioner

## 2014-11-21 VITALS — BP 162/60 | HR 71 | Temp 97.3°F | Resp 14 | Ht 62.0 in | Wt 201.4 lb

## 2014-11-21 DIAGNOSIS — D62 Acute posthemorrhagic anemia: Secondary | ICD-10-CM

## 2014-11-21 DIAGNOSIS — R062 Wheezing: Secondary | ICD-10-CM | POA: Insufficient documentation

## 2014-11-21 LAB — CBC WITH DIFFERENTIAL/PLATELET
BASOS PCT: 0.3 % (ref 0.0–3.0)
Basophils Absolute: 0 10*3/uL (ref 0.0–0.1)
EOS ABS: 0.7 10*3/uL (ref 0.0–0.7)
Eosinophils Relative: 7.3 % — ABNORMAL HIGH (ref 0.0–5.0)
HCT: 31.1 % — ABNORMAL LOW (ref 36.0–46.0)
HEMOGLOBIN: 10.2 g/dL — AB (ref 12.0–15.0)
Lymphocytes Relative: 14 % (ref 12.0–46.0)
Lymphs Abs: 1.3 10*3/uL (ref 0.7–4.0)
MCHC: 32.8 g/dL (ref 30.0–36.0)
MCV: 84.9 fl (ref 78.0–100.0)
MONO ABS: 0.5 10*3/uL (ref 0.1–1.0)
Monocytes Relative: 5.6 % (ref 3.0–12.0)
Neutro Abs: 6.9 10*3/uL (ref 1.4–7.7)
Neutrophils Relative %: 72.8 % (ref 43.0–77.0)
Platelets: 454 10*3/uL — ABNORMAL HIGH (ref 150.0–400.0)
RBC: 3.66 Mil/uL — AB (ref 3.87–5.11)
RDW: 18 % — ABNORMAL HIGH (ref 11.5–15.5)
WBC: 9.5 10*3/uL (ref 4.0–10.5)

## 2014-11-21 MED ORDER — ALBUTEROL SULFATE HFA 108 (90 BASE) MCG/ACT IN AERS: 2.0000 | INHALATION_SPRAY | Freq: Four times a day (QID) | RESPIRATORY_TRACT | Status: AC | PRN

## 2014-11-21 NOTE — Progress Notes (Signed)
Subjective:    Patient ID: Laura Duran, female    DOB: [birth year], 69 y.o.   MRN: [id number]  HPI  [name] is a 69 yo female with a CC of fatigue, cough, and medication management. Pt is very upset today.   1) Stress test and echo for cardiac issues by Dr. Juliann Paresallwood at [facility]. Pt states they have not given her results.  Took off Eliquis because of the bleeding 3 BM daily- some look "normal", Some are dark  Diet- Oatmeal in morning, apple and cranberry juice, salad veggies and Malaw[place]urkey in the evening.   2) She has questions about her Pantoprazole and why she is taking it.   3) She states she is wheezing. Having trouble coughing up mucous. Pt is taking daily lasix.  Wt Readings from Last 3 Encounters:  11/21/14 201 lb 6.4 oz (91.354 kg)  11/07/14 206 lb (93.441 kg)  10/31/14 205 lb (92.987 kg)     Review of Systems  Constitutional: Negative for fever, chills, diaphoresis, fatigue and unexpected weight change.  Respiratory: Positive for cough and wheezing. Negative for chest tightness and shortness of breath.   Cardiovascular: Negative for chest pain, palpitations and leg swelling.  Gastrointestinal: Negative for nausea, vomiting, abdominal pain, diarrhea, constipation, blood in stool, abdominal distention, anal bleeding and rectal pain.  Skin: Negative for rash.  Neurological: Negative for dizziness, weakness, numbness and headaches.  Psychiatric/Behavioral: The patient is not nervous/anxious.    Past Medical History  Diagnosis Date  . Arthritis   . Hypertension   . Hyperlipidemia   . Gout   . Heart attack 1997, 2001    Dr. Juliann Paresallwood every 6 month  . Angina at rest   . CAD (coronary artery disease)   . Paroxysmal atrial fibrillation   . Systolic murmur     Left sternal border  . PVD (peripheral vascular disease)   . Chronic renal insufficiency   . Bilateral carotid bruits   . CHF (congestive heart failure)     history of  . Colon polyps   . Obesity      History   Social History  . Marital Status: Married    Spouse Name: N/A  . Number of Children: 1  . Years of Education: 14   Occupational History  . Control Analyst     Retired   Social History Main Topics  . Smoking status: Former Games developermoker  . Smokeless tobacco: Never Used  . Alcohol Use: No  . Drug Use: No  . Sexual Activity: Not on file   Other Topics Concern  . Not on file   Social History Narrative   Adea grew up in [place], [place]. She is married to Goldsmithharles. They have 1 son [name](Bernard), 2 granddaughters [name](Gabrielle, DamonMadalyn) and 1 grandson [name](Zachary). [name] is retired. She enjoys singing. She sings in the church choir. She also enjoyed her cowboy movies.    Past Surgical History  Procedure Laterality Date  . Coronary artery bypass graft  1997    x 4. [facility]  . 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 [facility]    Family History  Problem Relation Age of Onset  . Diabetes Mother   . Kidney disease Father     Kidney tumor  . Hypertension Sister     Allergies  Allergen Reactions  . Vicodin [Hydrocodone-Acetaminophen]     Patient states, It makes me feel uncomfortable and takes me away from myself.    Current Outpatient Prescriptions  on File Prior to Visit  Medication Sig Dispense Refill  . ALPRAZolam (XANAX) 0.5 MG tablet Take 1 tablet (0.5 mg total) by mouth every 12 (twelve) hours as needed. 60 tablet 4  . amiodarone (PACERONE) 200 MG tablet Take 200 mg by mouth daily.    Marland Kitchen aspirin 81 MG tablet Take 81 mg by mouth daily.    . carvedilol (COREG) 25 MG tablet Take 1 tablet (25 mg total) by mouth 2 (two) times daily. 180 tablet 1  . folic acid (FOLVITE) 1 MG tablet Take 1 tablet (1 mg total) by mouth daily. 30 tablet 5  . furosemide (LASIX) 20 MG tablet Take 1 tablet (20 mg total) by mouth daily. Once a day as needed for edema 90 tablet 1  . hydrALAZINE (APRESOLINE) 25 MG tablet Take 1 tablet (25 mg total) by mouth 3 (three) times daily. 270 tablet 1  .  isosorbide mononitrate (IMDUR) 60 MG 24 hr tablet TAKE 1 TABLET BY MOUTH TWICE A DAY 60 tablet 1  . losartan (COZAAR) 50 MG tablet Take 1 tablet (50 mg total) by mouth daily. 90 tablet 1  . polyethylene glycol powder (GLYCOLAX/MIRALAX) powder Take 17 g by mouth. Take 1 heaping tablespoon by mouth daily.    . pravastatin (PRAVACHOL) 20 MG tablet Take 1 tablet (20 mg total) by mouth daily. 90 tablet 1  . ranolazine (RANEXA) 500 MG 12 hr tablet Take 1 tablet (500 mg total) by mouth 2 (two) times daily. 60 tablet 3  . traMADol (ULTRAM) 50 MG tablet Take 1 tablet (50 mg total) by mouth every 12 (twelve) hours as needed. 60 tablet 0  . apixaban (ELIQUIS) 5 MG TABS tablet Take 5 mg by mouth 2 (two) times daily.     No current facility-administered medications on file prior to visit.       Objective:   Physical Exam  Constitutional: She is oriented to person, place, and time. She appears well-developed and well-nourished. No distress.  BP 162/60 mmHg  Pulse 71  Temp(Src) 97.3 F (36.3 C) (Oral)  Resp 14  Ht  (1.575 m)  Wt 201 lb 6.4 oz (91.354 kg)  BMI 36.83 kg/m2  SpO2 98% Seeing Cardiology soon  HENT:  Head: Normocephalic and atraumatic.  Right Ear: External ear normal.  Left Ear: External ear normal.  Mouth/Throat: No oropharyngeal exudate.  Eyes: Right eye exhibits no discharge. Left eye exhibits no discharge. No scleral icterus.  Neck: Normal range of motion. Neck supple. No tracheal deviation present. No thyromegaly present.  Cardiovascular: Normal rate, regular rhythm and normal heart sounds.  Exam reveals no gallop and no friction rub.   No murmur heard. Pulmonary/Chest: Effort normal. No respiratory distress. She has wheezes. She has no rales. She exhibits no tenderness.  Neurological: She is alert and oriented to person, place, and time. No cranial nerve deficit. She exhibits normal muscle tone. Coordination normal.  Skin: Skin is warm and dry. No rash noted. She is not  diaphoretic.  Psychiatric: She has a normal mood and affect. Her behavior is normal. Judgment and thought content normal.      Assessment & Plan:

## 2014-11-21 NOTE — Assessment & Plan Note (Signed)
Asked pt to try Albuterol inhaler for wheezing. Pt was instructed that if taken too much then it could be problematic for her heart (arrhythmia and hypokalemia). Only use as needed for wheezing.

## 2014-11-21 NOTE — Assessment & Plan Note (Addendum)
Stable. Want to continue to monitor CBC w/ diff after intestinal bleeding. Pt d/c'd off of Eliquis. Pt is very upset and does not understand why she was on the medication in the first place. She was informed that it was prescribed because of her cardiac conditions and that she will need to talk with Dr. Juliann Paresallwood. FU prn.   Will check out stool samples for other issues since pt is concerned about "abnormal" stools. FU 2 weeks.

## 2014-11-21 NOTE — Progress Notes (Signed)
Pre visit review using our clinic review tool, if applicable. No additional management support is needed unless otherwise documented below in the visit note. 

## 2014-11-21 NOTE — Patient Instructions (Addendum)
Sent in your albuterol inhaler. Ask pharmacist how to use inhaler, they will give a demonstration.   Please visit the lab before leaving today. Bring back the stool sample to the lab.   We will follow up in 2 weeks.

## 2014-11-22 ENCOUNTER — Emergency Department: Payer: Self-pay | Admitting: Emergency Medicine

## 2014-11-22 ENCOUNTER — Other Ambulatory Visit: Payer: Self-pay | Admitting: Nurse Practitioner

## 2014-11-26 LAB — STOOL CULTURE

## 2014-12-03 ENCOUNTER — Telehealth: Payer: Self-pay

## 2014-12-03 NOTE — Telephone Encounter (Signed)
The patient is hoping to switch from C.Doss to Dr.Walker.  She did not specify the reason she wanted to change providers.

## 2014-12-03 NOTE — Telephone Encounter (Signed)
No. We do not allow change between providers. I would recommend she consider [facility] if she is not happy at our office.

## 2014-12-11 ENCOUNTER — Encounter
Admit: 2014-12-11 | Disposition: A | Discharge status: Home / Self Care | Payer: Self-pay | Attending: Internal Medicine | Admitting: Internal Medicine

## 2014-12-17 ENCOUNTER — Other Ambulatory Visit: Payer: Self-pay | Admitting: Nurse Practitioner

## 2014-12-17 NOTE — Telephone Encounter (Signed)
Last Ov 2.10.16.  Please advise refill

## 2014-12-18 ENCOUNTER — Other Ambulatory Visit: Payer: Self-pay | Admitting: *Deleted

## 2014-12-18 MED ORDER — ALPRAZOLAM 0.5 MG PO TABS: 0.5000 mg | ORAL_TABLET | Freq: Two times a day (BID) | ORAL | Status: AC | PRN

## 2014-12-18 NOTE — Telephone Encounter (Signed)
rx faxed

## 2014-12-24 ENCOUNTER — Other Ambulatory Visit: Payer: Self-pay | Admitting: *Deleted

## 2014-12-24 MED ORDER — CARVEDILOL 25 MG PO TABS: 25.0000 mg | ORAL_TABLET | Freq: Two times a day (BID) | ORAL | Status: AC

## 2015-01-11 ENCOUNTER — Encounter
Admit: 2015-01-11 | Disposition: A | Discharge status: Home / Self Care | Payer: Self-pay | Attending: Internal Medicine | Admitting: Internal Medicine

## 2015-02-02 NOTE — Consult Note (Signed)
Chief Complaint:  Subjective/Chief Complaint She feels ok today . Ready for planned intervention to LAD to help supply Dx1.   VITAL SIGNS/ANCILLARY NOTES: **Vital Signs.:   14-Oct-15 10:46  Vital Signs Type Recheck  Systolic BP Systolic BP 016  Diastolic BP (mmHg) Diastolic BP (mmHg) 64  Mean BP 91  *Intake and Output.:   14-Oct-15 10:11  Grand Totals Intake:   Output:  75    Net:  -75 24 Hr.:  575  Urine ml     Out:  75  Urinary Method  Void; BSC   Brief Assessment:  GEN well developed, well nourished, no acute distress   Cardiac Regular  murmur present  -- JVD   Respiratory normal resp effort  clear BS   Gastrointestinal Normal   Gastrointestinal details normal Soft  Nontender  Nondistended  No masses palpable   EXTR negative cyanosis/clubbing, negative edema   Lab Results: Cardiac Catherization:  13-Oct-15 13:28   Cardiac Catheterization  [facility] [address] [place], [place] [address] [phone]   Cardiovascular Catheterization Comprehensive Report   Patient: Laura Duran Study date: 07/24/2014 MR number: [id number] Account number: [id number]   DOB: November 03, 1945 Age: 6 years Gender: Female Race: Black Height: 61.8 in Weight: 192.3 lb   Interventional Cardiologist:  Lujean Amel, MD   SUMMARY:   -SPLANCHNIC AND RENAL VESSELS: The renal arteries were angiographically normal.   -Summary: Borderline overall LVF EF=50-55% LMain ok LAD 90% prox 100% distal(grafted) LIMA Circ patent stent prox and distal mild CAD OM1 severe CADdiffusely RCA 100% prox Grafts LIMA distal LAD ok SVG circ 100% SVG RCA 100% Bilat renals shot for CRI-> minor disease IMP severe CAD prox LAD with unprotected Dx1 PLAN PCI stent to native prox LAD to supply Dx1 will stage for tomorrow because of CRI and dye load   CORONARY CIRCULATION: The coronary circulation is right dominant. Proximal LAD: There was a discrete 90 % stenosis. Mid LAD: There  was a 50 % stenosis. Distal LAD: There was a 100 % stenosis. 1st diagonal: There was a discrete 75 % stenosis at the ostium of the vessel segment. Proximal circumflex: There was a diffuse 25 % stenosis at the site of a prior stent. Mid circumflex: There was a 50 % stenosis. Distal circumflex: There was a tubular 40 % stenosis at the site of a prior stent. 1st obtuse marginal: There was a diffuse 99 % stenosis. Proximal RCA: There was a 90% stenosis. Mid RCA: There was a 100 % stenosis. Graft to the distal LAD: The graft was a LIMA. Graft angiography showed minor luminal irregularities. Graft to the 1st obtuse marginal: The graft was a saphenous vein graft from the aorta. It was occluded. Graft to the RPDA: The graft was a saphenous vein graft from the aorta. It was occluded.   ABDOMINAL VESSELS: The renal arteries were angiographically normal.   VENTRICLES: There were no left ventricular global or regional wall motion abnormalities. The left ventricle was normal in size.   VALVES: AORTIC VALVE: The aortic valve was evaluated by left ventriculography. The aortic valve appeared to be structurally normal. The aortic valve leaflets exhibited normal thickness and normal excursion. There was no aortic stenosis. MITRAL VALVE: The mitral valve was evaluated by left ventriculography. The mitral valve appeared grossly normal. The mitral leaflets exhibited normal thickness and normal excursion. The mitral valve exhibited no regurgitation.   INDICATIONS: Angina/MI: unstable angina, CCS class IV.   HISTORY: The patient has hypertension and a  family history of coronary artery disease.   PRIOR DIAGNOSTIC TEST RESULTS: No prior stress test is available. The following pre-procedure tests were not performed: stress ECG, stress echocardiogram, stress nuclear, stress test with cardiac magnetic resonance, cardiac computerized tomographic angiography, or calcium score.   PROCEDURES PERFORMED: Left  heart catheterization with ventriculography. Bypass graft angiography. Left internal mammary angiography. Bilateral renal angiography. Procedure: Successful Closure with Mynx   COMPLICATIONS: No complication occurred during the cath lab visit.   PROCEDURE: The risks and alternatives of the procedures and conscious sedation were explained to the patient and informed consent was obtained. The patient was brought to the cath lab and placed on the table. The planned puncture sites were prepped anddraped in the usual sterile fashion.   -Right femoral artery access. The vessel was accessed, a wire was threaded into the vessel, and a was advanced over the wire into the vessel.   -Left heart catheterization. A catheter was advanced to the ascending aorta. Ventriculography was performed using power injection of contrast agent.   -Graft angiography. A catheter was advanced to the aorta.   -Left internal mammary angiography. A catheter was positioned.   -Bilateral renal angiography. A catheter was positioned.   -Successful Closure with Mynx.   PROCEDURE COMPLETION: TIMING: Test started at 13:51. Test concluded at 14:26. RADIATION EXPOSURE: Fluoroscopy time: 7.82 min. Fluoroscopy dose: 2.015 Gray. MEDICATIONS GIVEN: Midazolam, 1 mg, IV, at 13:42. Fentanyl, 50 mcg, IV, at 13:42. CONTRAST GIVEN: Isovue 160 ml.   Prepared and signed by   Lujean Amel, MD Signed 07/30/2014 13:13:08   STUDY DIAGRAM   Angiographic findings Native coronary lesions:  ProximalLAD: Lesion 1: discrete, 90 % stenosis.  Mid LAD: Lesion 1: 50 % stenosis.  Distal LAD: Lesion 1: 100 % stenosis.  D1: Lesion 1: discrete, 75 % stenosis. Proximal circumflex: Lesion 1: diffuse, 25 % stenosis, site of prior stent.  Mid circumflex: Lesion 1: 50 % stenosis. Distal circumflex: Lesion 1: tubular, 40 % stenosis, site of prior stent.  OM1: Lesion 1: diffuse, 99 % stenosis.  Proximal RCA: Lesion 1: 90 % stenosis.   Mid RCA: Lesion 1: 100 % stenosis.   Coronary graft lesions:  Graft to OM1: occluded SVG  Graft to RPDA: occluded SVG   HEMODYNAMIC TABLES   Pressures:  Baseline Pressures:  - HR: 67 Pressures:  - Rhythm: Pressures:  -- Aortic Pressure (S/D/M): 160/54/90 Pressures:  -- Left Ventricle (s/edp): 172/22/--   Outputs:  Baseline Outputs:  -- CALCULATIONS: Age in years: 68.24 Outputs:  -- CALCULATIONS: Body Surface Area: 1.88 Outputs:  -- CALCULATIONS: Height in cm: 157.00 Outputs:  -- CALCULATIONS: Sex: Female Outputs:  -- CALCULATIONS: Weight inkg: 87.40  14-Oct-15 12:19   Cardiac Catheterization  [address] [place] [place], [place] [address] [phone]   Cardiovascular Catheterization Comprehensive Report   Patient: Laura Duran Study date: 07/25/2014 MR number: [id number] Account number: [id number]   DOB: Jan 13, 1946 Age: 10 years Gender: Female Race: Black Height: 61.8 in Weight: 203.5 lb   Interventional Cardiologist:  Lujean Amel, MD   SUMMARY:   -1ST LESION INTERVENTIONS: A drug-eluting stent was performed on the lesion in the proximal LAD.   -Summary: Successful PCI stent DES to native prox LAD 90->0% to supply Dx1 [place]   CORONARY CIRCULATION: The coronary circulation is right dominant. Proximal LAD: There was a discrete 90 % stenosis.   INDICATIONS: Angina/MI: unstable angina, CCS class III.   PRIOR DIAGNOSTIC TEST RESULTS: No prior stress test is  available. The following pre-procedure tests were not performed: stress ECG, stress echocardiogram, stress nuclear, stress test with cardiac magnetic resonance, cardiac computerized tomographic angiography, or calcium score.   PROCEDURES PERFORMED: Procedure: Successful Closure with Mynx Intervention on proximal LAD: drug-eluting stent.   COMPLICATIONS: No complication occurred during the cath lab visit.   PROCEDURE: The risks and alternatives of the procedures and  conscious sedation were explained to the patient and informed consent was obtained. The patient was brought to the cath lab and placed on the table. The planned puncture sites were prepped and draped in the usual sterile fashion. Oxygen 2 L/min.   -ACT measurement.   -Right femoral artery access. The vessel was accessed, a wire was threaded into the vessel, and a was advanced over the wire into the vessel.   -Right femoral vein access. The vessel was accessed, a wire was threaded into the vessel, and a was advanced over the wire into the vessel.   -Successful Closure with Mynx.   LESION INTERVENTION: A drug-eluting stent was performed on the lesion in the proximal LAD. There was no dissection.   -Balloon angioplasty was performed, using a Rx Trek 3.0 x 36m balloon, with 2 inflation(s) and a maximum inflation pressure of 10 atm.   -A Xience Alpine 3.50 x 114mdrug-eluting stent was placed across the lesion and deployed at a maximum inflation pressure of 11 atm.   PROCEDURE COMPLETION: TIMING: Test started at 12:50. Test concluded at 13:34. RADIATION EXPOSURE: Fluoroscopy time: 8.95 min. Fluoroscopy dose: 2.175 Gray. MEDICATIONS GIVEN: Midazolam,1 mg, IV, at 12:48. Fentanyl, 50 mcg, IV, at 12:48. Midazolam, 1 mg, IV, at 13:05. Fentanyl, 50 mcg, IV, at 13:05. Aspirin, 324 mg, PO, last dose at 12:43. Clopidogrel (Plavix), 300 mg, PO, last dose at 12:43. Angiomax Bolus, 13.9 ml, IV, at 12:57. Angiomax Drip, infusion rate of 32.4 ml/hr, IV, at 13:23. CONTRAST GIVEN: Isovue 175 ml.   Prepared and signed by   DwLujean AmelMD Signed 07/30/2014 13:15:36   STUDY DIAGRAM   Angiographic findings Native coronary lesions:  Proximal LAD: Lesion 1: discrete, 90 % stenosis. Intervention results Native coronary lesions: drug-eluting stent of proximal LAD. Stent: Xience Alpine 3.50 x 1541mdrug-eluting.   HEMODYNAMIC TABLES   Pressures:  Baseline Pressures:  - HR:  68 Pressures:  - Rhythm: Pressures:  -- Aortic Pressure (S/D/M): 153/50/85   Outputs:  Baseline Outputs:  -- CALCULATIONS: Age in years: 68.25 Outputs:  -- CALCULATIONS: Body Surface Area: 1.92 Outputs:  -- CALCULATIONS: Height in cm: 157.00 Outputs:-- CALCULATIONS: Sex: Female Outputs:  -- CALCULATIONS: Weight in kg: 92.50  Cardiology:  14-Oct-15 13:53   Ventricular Rate 59  Atrial Rate 59  P-R Interval 172  QRS Duration 94  QT 432  QTc 427  P Axis 63  R Axis 37  T Axis 38  ECG interpretation Sinus bradycardia Nonspecific T wave abnormality Abnormal ECG When compared with ECG of 23-Jul-2014 06:45, No significant change was found ----------unconfirmed---------- Confirmed by OVERREAD, NOT ([id number]), editor PEARSON, BARBARA ([id number]) on 07/26/2014 8:03:17 AM  Routine Chem:  13-Oct-15 01:06   Glucose, Serum 86  BUN  21  Creatinine (comp)  1.67  Sodium, Serum 137  Potassium, Serum 4.1  Chloride, Serum  111  CO2, Serum 24  Calcium (Total), Serum  8.4  Anion Gap  2  Osmolality (calc) 276  eGFR (African American)  39  eGFR (Non-African American)  32 (eGFR values <76m4mn/1.73 m2 may be an indication of chronic kidney disease (CKD).  Calculated eGFR, using the MRDR Study equation, is useful in  patients with stable renal function. The eGFR calculation will not be reliable in acutely ill patients when serum creatinine is changing rapidly. It is not useful in patients on dialysis. The eGFR calculation may not be applicable to patients at the low and high extremes of body sizes, pregnant women, and vetetarians.)  Cardiac:  14-Oct-15 23:28   CK, Total 47 (26-192 NOTE: NEW REFERENCE RANGE  11/13/2013)  Routine Coag:  13-Oct-15 01:06   Prothrombin 14.0  INR 1.1 (INR reference interval applies to patients on anticoagulant therapy. A single INR therapeutic range for coumarins is not optimal for all indications; however, the suggested range for most indications is 2.0 -  3.0. Exceptions to the INR Reference Range may include: Prosthetic heart valves, acute myocardial infarction, prevention of myocardial infarction, and combinations of aspirin and anticoagulant. The need for a higher or lower target INR must be assessed individually. Reference: The Pharmacology and Management of the Vitamin K  antagonists: the seventh ACCP Conference on Antithrombotic and Thrombolytic Therapy. JKKXF.8182 Sept:126 (3suppl): [id number]. A HCT value >55% may artifactually increase the PT.  In one study,  the increase was an average of 25%. Reference:  "Effect on Routine and Special Coagulation Testing Values of Citrate Anticoagulant Adjustment in Patients with High HCT Values." American Journal of Clinical Pathology 2006;126:400-405.)  Routine Hem:  13-Oct-15 01:06   Platelet Count (CBC) 289 (Result(s) reported on 24 Jul 2014 at 01:32AM.)   Radiology Results: XRay:    11-Oct-15 14:17, Chest Portable Single View  Chest Portable Single View   REASON FOR EXAM:    chest pain  COMMENTS:       PROCEDURE: DXR - DXR PORTABLE CHEST SINGLE VIEW  - Jul 22 2014  2:17PM     CLINICAL DATA:  Chest pain.    EXAM:  PORTABLE CHEST - 1 VIEW    COMPARISON:  03/02/2012    FINDINGS:  Heart size is stable andat the upper limits of normal. Both lungs  are clear. No evidence of pleural effusion. Prior CABG noted.  Several broken sternotomy wires again noted.     IMPRESSION:  Stable exam.  No active disease.      Electronically Signed    By: Earle Gell M.D.    On: 07/22/2014 14:22         Verified By: Marlaine Hind, M.D.,  Cardiac Catherization:    13-Oct-15 13:28, Cardiac Catheterization  Cardiac Catheterization   [facility]  [place], [place] [address]  [phone]     Cardiovascular Catheterization Comprehensive Report     Patient: SHOLONDA JOBST  Study date: 07/24/2014  MR number: [id number]  Account number: [id number]     DOB:  1946-03-29  Age: 27 years  Gender: Female  Race: Black  Height: 61.8 in  Weight: 192.3 lb     Interventional Cardiologist:  Lujean Amel, MD     SUMMARY:     -SPLANCHNIC AND RENAL VESSELS: The renal arteries were  angiographically normal.     -Summary: Borderline overall LVF  EF=50-55%  LMain ok  LAD 90% prox 100% distal(grafted) LIMA  Circ patent stent prox and distal mild CAD  OM1 severe CADdiffusely  RCA 100% prox  Grafts  LIMA distal LAD ok  SVG circ 100%  SVG RCA 100%  Bilat renals shot for CRI-> minor disease  IMP  severe CAD prox LAD with unprotected Dx1  PLAN  PCI  stent to native prox LAD to supply Dx1  will stage for tomorrow because of CRI and dye load     CORONARY CIRCULATION: The coronary circulation is right dominant.  Proximal LAD: There was a discrete 90 % stenosis. Mid LAD: There was  a 50 % stenosis. Distal LAD: There was a 100 % stenosis. 1st  diagonal: There was a discrete 75 % stenosis at the ostium of the  vessel segment. Proximal circumflex: There was a diffuse 25 %  stenosis at the site of a prior stent. Mid circumflex: There was a 50  % stenosis. Distal circumflex: There was a tubular 40 % stenosis at  the site of a prior stent. 1st obtuse marginal: There was a diffuse  99 % stenosis. Proximal RCA: There was a 90% stenosis. Mid RCA:  There was a 100 % stenosis. Graft to the distal LAD: The graft was a  LIMA. Graft angiography showed minor luminal irregularities. Graft to  the 1st obtuse marginal: The graft was a saphenous vein graft from  the aorta. It was occluded. Graft to the RPDA: The graft was a  saphenous vein graft from the aorta. It was occluded.     ABDOMINAL VESSELS: The renal arteries were angiographically normal.     VENTRICLES: There were no left ventricular global or regional wall  motion abnormalities. The left ventricle was normal in size.     VALVES: AORTIC VALVE: The aortic valve was evaluated by left  ventriculography.  The aortic valve appeared to be structurally  normal. The aortic valve leaflets exhibited normal thickness and  normal excursion. There was no aortic stenosis. MITRAL VALVE: The  mitral valve was evaluated by left ventriculography. The mitral valve  appeared grossly normal. The mitral leaflets exhibited normal  thickness and normal excursion. The mitral valve exhibited no  regurgitation.     INDICATIONS: Angina/MI: unstable angina, CCS class IV.     HISTORY: The patient has hypertension and a family history of coronary  artery disease.     PRIOR DIAGNOSTIC TEST RESULTS: No prior stress test is available. The  following pre-procedure tests were not performed: stress ECG, stress  echocardiogram, stress nuclear, stress test with cardiac magnetic  resonance, cardiac computerized tomographic angiography, or calcium  score.     PROCEDURES PERFORMED: Left heart catheterization with  ventriculography. Bypass graft angiography. Left internal mammary  angiography. Bilateral renal angiography. Procedure: Successful  Closure with Mynx     COMPLICATIONS: No complication occurred during the cath lab visit.     PROCEDURE: The risks and alternatives of the procedures and conscious  sedation were explained to the patient and informed consent was  obtained. The patient was brought to the cath lab and placed on the  table. The planned puncture sites were prepped anddraped in the  usual sterile fashion.     -Right femoral artery access. The vessel was accessed, a wire was  threaded into the vessel, and a was advanced over the wire into the  vessel.     -Left heart catheterization. A catheter was advanced to the ascending  aorta. Ventriculography was performed using power injection of  contrast agent.     -Graft angiography. A catheter was advanced to the aorta.     -Left internal mammary angiography. A catheter was positioned.     -Bilateral renal angiography. A catheter was positioned.      -Successful Closure with Mynx.     PROCEDURE COMPLETION: TIMING: Test started at 13:51. Test concluded at  14:26. RADIATION EXPOSURE: Fluoroscopy time: 7.82 min. Fluoroscopy  dose: 2.015 Gray.  MEDICATIONS GIVEN: Midazolam, 1 mg, IV, at 13:42. Fentanyl, 50 mcg,  IV, at 13:42.  CONTRAST GIVEN: Isovue 160 ml.     Prepared and signed by     Lujean Amel, MD  Signed 07/30/2014 13:13:08     STUDY DIAGRAM     Angiographic findings  Native coronary lesions:   ProximalLAD: Lesion 1: discrete, 90 % stenosis.   Mid LAD: Lesion 1: 50 % stenosis.   Distal LAD: Lesion 1: 100 % stenosis.   D1: Lesion 1: discrete, 75 % stenosis.  Proximal circumflex: Lesion 1: diffuse, 25 % stenosis, site of prior  stent.   Mid circumflex: Lesion 1: 50 % stenosis.  Distal circumflex: Lesion 1: tubular, 40 % stenosis, site of prior  stent.   OM1: Lesion 1: diffuse, 99 % stenosis.   Proximal RCA: Lesion 1: 90 % stenosis.   Mid RCA: Lesion 1: 100 % stenosis.     Coronary graft lesions:   Graft to OM1: occluded SVG   Graft to RPDA: occluded SVG     HEMODYNAMIC TABLES     Pressures:  Baseline  Pressures:  - HR: 67  Pressures:  - Rhythm:  Pressures:  -- Aortic Pressure (S/D/M): 160/54/90  Pressures:  -- Left Ventricle (s/edp): 172/22/--     Outputs:  Baseline  Outputs:  -- CALCULATIONS: Age in years: 68.24  Outputs:  -- CALCULATIONS: Body Surface Area: 1.88  Outputs:  -- CALCULATIONS: Height in cm: 157.00  Outputs:  -- CALCULATIONS: Sex: Female  Outputs:  -- CALCULATIONS: Weight inkg: 87.40    14-Oct-15 12:19, Cardiac Catheterization  Cardiac Catheterization   [facility]  [address] [place], [place] [address]  [phone]     Cardiovascular Catheterization Comprehensive Report     Patient: Laura Duran  Study date: 07/25/2014  MR number: [id number]  Account number: [id number]     DOB: 07/11/1946  Age: 71 years  Gender: Female  Race: Black  Height: 61.8 in  Weight:  203.5 lb     Interventional Cardiologist:  Lujean Amel, MD     SUMMARY:     -1ST LESION INTERVENTIONS: A drug-eluting stent was performed on the  lesion in the proximal LAD.     -Summary: Successful PCI stent DES to native prox LAD 90->0% to supply  Dx1  [place]     CORONARY CIRCULATION: The coronary circulation is right dominant.  Proximal LAD: There was a discrete 90 % stenosis.     INDICATIONS: Angina/MI: unstable angina, CCS class III.     PRIOR DIAGNOSTIC TEST RESULTS: No prior stress test is available. The  following pre-procedure tests were not performed: stress ECG, stress  echocardiogram, stress nuclear, stress test with cardiac magnetic  resonance, cardiac computerized tomographic angiography, or calcium  score.     PROCEDURES PERFORMED: Procedure: Successful Closure with Mynx  Intervention on proximal LAD: drug-eluting stent.     COMPLICATIONS: No complication occurred during the cath lab visit.     PROCEDURE: The risks and alternatives of the procedures and conscious  sedation were explained to the patient and informed consent was  obtained. The patient was brought to the cath lab and placed on the  table. The planned puncture sites were prepped and draped in the  usual sterile fashion. Oxygen 2 L/min.     -ACT measurement.     -Right femoral artery access. The vessel was accessed, a wire was  threaded into the vessel, and a was advanced over the wire into the  vessel.     -Right femoral vein access. The vessel was accessed, a wire was  threaded into the vessel, and a was advanced over the wire into the  vessel.     -Successful Closure with Mynx.     LESION INTERVENTION: A drug-eluting stent was performed on the lesion  in the proximal LAD. There was no dissection.     -Balloon angioplasty was performed, using a Rx Trek 3.0 x 43m  balloon, with 2 inflation(s) and a maximum inflation pressure of 10  atm.     -A Xience Alpine 3.50 x 157m drug-eluting stent was placed across the  lesion and deployed at a maximum inflation pressure of 11 atm.     PROCEDURE COMPLETION: TIMING: Test started at 12:50. Test concluded at  13:34. RADIATION EXPOSURE: Fluoroscopy time: 8.95 min. Fluoroscopy  dose: 2.175 Gray.  MEDICATIONS GIVEN: Midazolam,1 mg, IV, at 12:48. Fentanyl, 50 mcg,  IV, at 12:48. Midazolam, 1 mg, IV, at 13:05. Fentanyl, 50 mcg, IV, at  13:05. Aspirin, 324 mg, PO, last dose at 12:43. Clopidogrel (Plavix),  300 mg, PO, last dose at 12:43. Angiomax Bolus, 13.9 ml, IV, at  12:57. Angiomax Drip, infusion rate of 32.4 ml/hr, IV, at 13:23.  CONTRAST GIVEN: Isovue 175 ml.     Prepared and signed by     DwLujean AmelMD  Signed 07/30/2014 13:15:36     STUDY DIAGRAM     Angiographic findings  Native coronary lesions:   Proximal LAD: Lesion 1: discrete, 90 % stenosis.  Intervention results  Native coronary lesions:  drug-eluting stent of proximal LAD. Stent: Xience Alpine 3.50 x 1561m drug-eluting.     HEMODYNAMIC TABLES     Pressures:  Baseline  Pressures:  - HR: 68  Pressures:  - Rhythm:  Pressures:  -- Aortic Pressure (S/D/M): 153/50/85     Outputs:  Baseline  Outputs:  -- CALCULATIONS: [age] 69.25  Outputs:  -- CALCULATIONS: Body Surface Area: 1.92  Outputs:  -- CALCULATIONS: Height in cm: 157.00  Outputs:-- CALCULATIONS: Sex: Female  Outputs:  -- CALCULATIONS: Weight in kg: 92.50  Cardiology:    11-Oct-15 13:34, ED ECG  Ventricular Rate 67  Atrial Rate 67  P-R Interval 134  QRS Duration 94  QT 410  QTc 433  P Axis 46  R Axis 10  T Axis 11  ECG interpretation   Normal sinus rhythm  Minimal voltage criteria for LVH, may be normal variant  Inferior infarct (cited on or before 17-Dec-2009)  Abnormal ECG  When compared with ECG of 02-Mar-2012 05:13,  Sinus rhythm has replaced Atrial flutter  Vent. rate has decreased BY  56 BPM  Non-specific change in ST segment in Inferior leads  ST no  longer depressed in Anterolateral leads  T wave inversion no longer evident in Lateral leads  ----------unconfirmed----------  Confirmed by OVERREAD, NOT ([id number]), editor PEARSON, BARBARA ([id number]) on 07/23/2014 12:45:14 PM  ED ECG     11-Oct-15 16:22, ECG  Ventricular Rate 58  Atrial Rate 288  QRS Duration 96  QT 422  QTc 414  R Axis 4  T Axis 11  ECG interpretation   Junctional rhythm  Minimal voltage criteria for LVH, may be normal variant  Inferior infarct (cited on or before 17-Dec-2009)  Abnormal ECG  When compared with ECG of 02-Mar-2012 05:13,  Junctional rhythm has replaced Atrial flutter  Vent.  rate has decreased BY  65 BPM  ST no longer depressed in Anterolateral leads  Nonspecific T wave abnormality has replaced inverted T waves in Lateral leads  ----------unconfirmed----------  Confirmed by OVERREAD, NOT ([id number]), editor PEARSON, BARBARA ([id number]) on 07/23/2014 12:45:27 PM  ECG     11-Oct-15 17:08, Echo Doppler  Echo Doppler   REASON FOR EXAM:      COMMENTS:       PROCEDURE: [facility] - ECHO DOPPLER COMPLETE(TRANSTHOR)  - Jul 22 2014  5:08PM     RESULT: Echocardiogram Report    Patient Name:   Laura Duran Date of Exam: 07/22/2014  Medical Rec #:  [id number]           Custom1:  Date of Birth:  08/09/1946        Height:       62.0 in  Patient Age:    69 years         Weight:       193.0 lb  Patient Gender: F                BSA:          1.88 m??    Indications: MI  Sonographer:    Arville Go RDCS  Referring Phys: STAFFORD,PHILLIP    Sonographer Comments: Technically difficult study due to poor echo   windows and suboptimal subcostal window.    Summary:   1. Left ventricular ejection fraction, by visual estimation, is 55 to   60%.   2. Normal global left ventricular systolic function.   3. Moderate tricuspid regurgitation.   4. Mildly elevated pulmonary artery systolic pressure.  2D AND M-MODE MEASUREMENTS (normal ranges within parentheses):  Left Ventricle:           Normal  IVSd (2D):      1.19 cm (0.7-1.1)  LVPWd(2D):     1.25 cm (0.7-1.1) Aorta/LA:                  Normal  LVIDd (2D):     4.25 cm (3.4-5.7) Aortic Root (2D): 2.90 cm (2.4-3.7)  LVIDs (2D):     2.97 cm           Left Atrium (2D): 3.90 cm (1.9-4.0)  LV FS (2D):     30.1 %   (>25%)  LV EF (2D):   57.7 %   (>50%)                                    Right Ventricle:                                    RVd (2D):  LV DIASTOLIC FUNCTION:  MV Peak E: 0.69 m/s Decel Time: 248 msec  MV Peak A: 0.72 m/s  E/A Ratio: 0.95  SPECTRAL DOPPLER ANALYSIS (where applicable):  Mitral Valve:  MV P1/2 Time: 71.92 msec  MV Area, PHT: 3.06 cm??  Aortic Valve: AoV Max Vel: 1.30 m/s AoV Peak PG: 6.8 mmHg AoV Mean PG:  LVOT Vmax: 0.96 m/s LVOT VTI:  LVOT Diameter: 1.80 cm  AoV Area, Vmax: 1.89 cm?? AoV Area, VTI:  AoV Area, Vmn:  Tricuspid Valve and PA/RV Systolic Pressure: TR Max Velocity: 2.69 m/s RA   Pressure: 10 mmHg RVSP/PASP: 38.9 mmHg  Pulmonic Valve:  PV Max Velocity: 0.77 m/s  PV Max PG: 2.4 mmHg PV Mean PG:    PHYSICIAN INTERPRETATION:  Left Ventricle: The left ventricular internal cavity size was normal. LV   septal wall thickness was normal. LV posterior wall thickness was normal.   Global LV systolic function was normal. Left ventricular ejection   fraction, by visual estimation, is 55 to 60%.  Left Atrium: The left atrium is normal in size.  Right Atrium: The right atrium is normal in size.  Mitral Valve: The mitral valve is not well seen. Trace mitral valve     regurgitation is seen.  Tricuspid Valve: The tricuspid valve is not well seen.Moderate tricuspid   regurgitation is visualized. The tricuspid regurgitant velocity is 2.69   m/s, and with an assumed right atrial pressure of 10 mmHg, the estimated   right ventricular systolic pressure is mildly elevated at 38.9 mmHg.  Aortic Valve: The aortic valve was not well seen. No evidence of aortic   valve regurgitation is seen.    [place] MD  Electronically signed by [id number] Bartholome Bill MD  Signature Date/Time: 07/23/2014/7:43:33 AM    *** Final ***    IMPRESSION: .    Verified By: Teodoro Spray, M.D., MD    12-Oct-15 06:45, ECG  Ventricular Rate 55  Atrial Rate 55  P-R Interval 168  QRS Duration 100  QT 448  QTc 428  P Axis 39  R Axis 32  T Axis 44  ECG interpretation   Sinus bradycardia  Otherwise normal ECG  No previous ECGs available  Confirmed by ARIDA, MUHAMMAD ([id number]) on 07/23/2014 1:45:51 PM    Overreader: Kathlyn [place]  ECG     14-Oct-15 13:53, ECG  Ventricular Rate 59  Atrial Rate 59  P-R Interval 172  QRS Duration 94  QT 432  QTc 427  P Axis 63  R Axis 37  T Axis 38  ECG interpretation   Sinus bradycardia  Nonspecific T wave abnormality  Abnormal ECG  When compared with ECG of 23-Jul-2014 06:45,  No significant change was found  ----------unconfirmed----------  Confirmed by OVERREAD, NOT ([id number]), editor PEARSON, BARBARA ([id number]) on 07/26/2014 8:03:17 AM  ECG    Assessment/Plan:  Assessment/Plan:  Assessment IMP  unstable angina  coronary disease  hypertension  hyperlipidemia  chronic renal insufficiency  atrial fibrillation  peripheral vascular disease  status post cardiac catheterization with significant disease in the LAD  GERD  anxiety .   Plan PLAN   PCI and stent of proximal LAD today  procedure staged because of significant renal insufficiency and dye load  renal protection prior to staged procedure  continue anticoagulation short and long term  hypertension therapy  statin therapy for hyperlipidemia  continue therapy for reflux  referred the patient and Nephrology for evaluation and therapy renal insufficiency  PCI and stent today with DES   Electronic Signatures: Lujean Amel D (MD)  (Signed [phone] 21:49)  Authored: Chief Complaint, VITAL SIGNS/ANCILLARY NOTES, Brief Assessment, Lab Results, Radiology Results, Assessment/Plan   Last  Updated: 10-Nov-15 21:49 by Yolonda Kida (MD)

## 2015-02-02 NOTE — H&P (Signed)
PATIENT NAME:  Laura JourdainHOOPER, Laura E MR#:  [id number] DATE OF BIRTH:  1946-10-08  PRIMARY CARE PHYSICIAN:  Serita Shellerrnest B. Maryellen PileEason, MD  PRIMARY CARDIOLOGIST:  Dwayne D. Juliann Paresallwood, MD  REFERRING PHYSICIAN: Darien Ramusavid W. Kaminski, MD  CHIEF COMPLAINT: Chest pain.   HISTORY OF PRESENT ILLNESS: The patient is a 69 year old African American female with a past medical history of coronary artery disease and multiple other medical problems, who is presenting to the ED with a chief complaint of midsternal chest pain for the past 4 days. The pain is in the middle of the chest, radiating across the left and right anterior chest wall. She feels like a substernal pressure associated with shortness of breath. Progressively the pain is getting worse and was seen by Dr. Juliann Paresallwood on Friday, who had started her on amiodarone and Eliquis.  The patient did not like taking Eliquis and stopped taking since yesterday as it made her nauseous and vomiting. The patient reports that her last stress test was several years ago. The patient was given aspirin and sublingual nitroglycerin in the ED with no significant improvement. Nitroglycerin  paste was attached to the anterior chest wall, but still complaining of chest pressure. The patient reports that any kind of movements or pressing on the anterior chest wall is making the pain worse. Family members at her bedside. No similar complaints in the past. Initial troponin is at 0.10. The patient was given Lovenox 1 mg per kg subcutaneous by the ED physician, Dr. Carollee MassedKaminski. EKG did not reveal any acute ST-T wave changes.   PAST MEDICAL HISTORY:  1.  Coronary artery disease, status post myocardial infarction, status post bypass and stenting. Last cardiac catheterization was done during February 2013 by Dr. Juliann Paresallwood.  2.  Abdominal aortic aneurysm.  3.  GERD. 4.  Chronic obstructive pulmonary disease.  5.  Chronic kidney disease stage 3.  Baseline creatinine at around 1.5.  6.  History of decubitus  ulcer.  7.  Anemia of chronic disease.  8.  Dyslipidemia. 9.  Hypertension. 10.  Rheumatoid arthritis. 11.  Gout. 12.  Anxiety, panic disorder. 13.  Peripheral vascular disease. 14.  Obesity.   PAST SURGICAL HISTORY:  1.  Hysterectomy.  2.  Coronary artery bypass grafting. 3.  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   ALLERGIES:  The patient has no known drug allergies.   PSYCHOSOCIAL HISTORY: Lives at home with husband, currently retired. Quit smoking 15 years ago. Denies any alcohol or illicit drug use.   FAMILY HISTORY: Hypertension and heart disease runs in her family. Father died from heart disease.   HOME MEDICATIONS: Ranexa 500 mg p.o. b.i.d., prednisone 5 mg p.o. once daily, nitroglycerin 0.4 mg sublingually as needed, Imdur 60 mg 1 tablet p.o. on a daily basis. Hydralazine 25 mg 1 tablet p.o. 3 times a day, furosemide 20 mg once daily, folic acid 1 mg p.o. once daily, Uloric 40 mg p.o. once daily, Cozaar 50 mg p.o. once daily, Plavix 75 mg p.o. once daily, Coreg 25 mg p.o. 2 times a day, aspirin 81 mg once daily, alprazolam 0.5 mg 1 tablet p.o. 2 times a day, allopurinol 100 mg 1 tablet p.o. once daily.   REVIEW OF SYSTEMS: CONSTITUTIONAL: Denies any fever. Complaining of fatigue and weakness.  EYES: Denies blurry vision, double vision, glaucoma.  ENT: Denies epistaxis, discharge, snoring.  RESPIRATORY:  Denies cough. Has chronic history of COPD.  CARDIOVASCULAR: Complaining of midsternal chest wall tenderness, getting worse with movements. Denies any palpitations, syncope.  GASTROINTESTINAL:  Denies nausea, vomiting, diarrhea, abdominal pain.  GENITOURINARY: No dysuria, hematuria. GYNECOLOGIC/BREASTS: Denies breast mass or vaginal discharge.  ENDOCRINE: Denies polyuria, nocturia, thyroid problems.  HEMATOLOGIC: No easy bruising, bleeding, but has chronic anemia from chronic kidney disease. INTEGUMENTARY: No acne, rash, lesions.   MUSCULOSKELETAL: No joint  pain in the neck and back. Has below-knee amputation. NEUROLOGIC:  Denies any vertigo, ataxia.  PSYCHIATRIC: Has chronic history of anxiety and insomnia.  PHYSICAL EXAMINATION: VITAL SIGNS: Temperature 98.3, pulse 57, respirations 18, blood pressure 150/53, pulse oximetry is 99%.  GENERAL APPEARANCE: Not in acute distress, uncomfortable from anterior chest wall tenderness with any kind of movements. HEENT: Normocephalic, atraumatic. Pupils are equally reacting to light and accommodation. No  scleral icterus. No conjunctival injection. No sinus tenderness. No postnasal drip.  NECK: Supple. No JVD or no thyromegaly. Range of motion is intact.  LUNGS: Clear to auscultation bilaterally. No accessory muscle usage. Positive reproducible anterior chest wall tenderness on palpation.  CARDIOVASCULAR: S1, S2 normal, regular rate and rhythm. No murmurs.  GASTROINTESTINAL: Soft. Bowel sounds are positive in all 4 quadrants. Nontender, nondistended. No hepatosplenomegaly. No masses felt.  NEUROLOGIC: Awake, alert and oriented x 3. Cranial nerves II through XII are grossly intact. Motor and sensory are intact. Reflexes are 2+.  EXTREMITIES: Below-knee amputation. No edema. No cyanosis. No clubbing.  SKIN: Warm to touch. Normal turgor. No rashes. No lesions.  MUSCULOSKELETAL: No joint effusion, tenderness, erythema.  PSYCHIATRIC: Normal mood and affect.   LABORATORY AND IMAGING STUDIES: Glucose 140, BUN 19, creatinine 1.78, sodium   and potassium are normal. Chloride 108, CO2 of 25. Anion gap is 6. GFR 30. Serum osmolality and calcium are normal. Troponin 0.10. WBC 8.7, hemoglobin 10.2, hematocrit 31.7, platelets are 303,000. Chest x-ray, portable, single view was stable. No active disease. A 12-lead EKG: Normal sinus rhythm with no acute ST-T wave changes,  normal PR interval.  ASSESSMENT AND PLAN:  A 69 year old [place] American female presented to the Emergency Department with a chief complaint of 4 day  history of chest pain which is getting worse.  Seen by Dr. Juliann Pares on Friday. The patient was started on amiodarone and Eliquis, but patient is refusing to take those medications as they made her nauseous.  The patient has history of coronary artery disease, status post CABG in the year 2013. Her troponin is elevated at 0.1. EKG did not reveal any significant changes.  1.  Unstable angina with history of coronary artery disease and status post coronary artery  bypass grafting. Will admit her to telemetry. Lovenox 1 mg per kg subcutaneous x1 was given in the Emergency Department.  We will continue Lovenox 1 mg per kg, while cardiac enzymes were recycled. The patient will be on aspirin 81 mg, Plavix 75 mg. Continue beta blocker and statin. Cardiology consult is placed to Dr. Juliann Pares. We will obtain echocardiogram.  2.   Chronic history of gout. No exacerbation. We will continue allopurinol and Uloric.  3.  Hypertension. Blood pressure is stable. Will continue her home medication and beta blocker, losartan and hydralazine. 4.  Hyperlipidemia. Continue statin.  5.  Chronic obstructive pulmonary disease; the patient not under exacerbation. We will provide nebulizer treatments on as needed basis.  6.  Chronic renal insufficiency stage 3. Baseline creatinine at around 1.5, stable. We will avoid nephrotoxins.  7.  Anemia of chronic kidney disease.  8.  Chronic history of rheumatoid arthritis; the patient is on prednisone. We will continue the same.  9.  Peripheral vascular disease, status post below-knee amputation on the left lower extremity. We will continue close monitoring.   CODE STATUS: She is full code. Husband is medical power of attorney. Diagnosis and plan of care was discussed in detail with the patient and her husband at bedside. They all verbalized understanding of the plan. Total time spent on the admission is 50 minutes.   ____________________________ Ramonita Lab, MD ag:LT D: 07/22/2014  16:57:01 ET T: 07/22/2014 18:11:26 ET JOB#: [id number]  cc: Ramonita Lab, MD, <Dictator> Dwayne D. Juliann Pares, MD Serita Sheller Maryellen Pile, MD Ramonita Lab MD ELECTRONICALLY SIGNED 07/23/2014 15:50

## 2015-02-02 NOTE — Consult Note (Signed)
Chief Complaint:  Subjective/Chief Complaint Patient states to be doing reasonably well denies any chest pain right groin appears to be doing well no significant shortness of breath has not ambulated yet.   VITAL SIGNS/ANCILLARY NOTES: **Vital Signs.:   15-Oct-15 07:30  Vital Signs Type Routine  Temperature Temperature (F) 97.9  Celsius 36.6  Pulse source if not from Vital Sign Device per cardiac monitor  Respirations Respirations 14  Systolic BP Systolic BP 793  Diastolic BP (mmHg) Diastolic BP (mmHg) 50  Mean BP 87  Oxygen Delivery Room Air/ 21 %  *Intake and Output.:   Daily 15-Oct-15 07:00  IV (Primary)      In:  440  IV (Primary)      In:  900  Urine ml     Out:  800  Length of Stay Totals Intake:  3995 Output:  3875    Net:  120   Brief Assessment:  GEN well developed, well nourished, no acute distress   Cardiac Regular  murmur present   Respiratory clear BS   Gastrointestinal Normal   Gastrointestinal details normal Soft  Nontender  Nondistended  No masses palpable   EXTR negative cyanosis/clubbing, negative edema   Lab Results: Cardiology:  14-Oct-15 13:53   Ventricular Rate 59  Atrial Rate 59  P-R Interval 172  QRS Duration 94  QT 432  QTc 427  P Axis 63  R Axis 37  T Axis 38  ECG interpretation Sinus bradycardia Nonspecific T wave abnormality Abnormal ECG When compared with ECG of 23-Jul-2014 06:45, No significant change was found ----------unconfirmed---------- Confirmed by OVERREAD, NOT ([id number]), editor PEARSON, BARBARA ([id number]) on 07/26/2014 8:03:17 AM  Routine Chem:  15-Oct-15 04:33   Glucose, Serum 70  BUN 15  Creatinine (comp)  1.36  Sodium, Serum 140  Potassium, Serum 3.6  Chloride, Serum 106  CO2, Serum 27  Calcium (Total), Serum  8.2  Anion Gap 7  Osmolality (calc) 279  eGFR (African American)  50  eGFR (Non-African American)  41 (eGFR values <9m/min/1.73 m2 may be an indication of chronic kidney disease (CKD). Calculated eGFR,  using the MRDR Study equation, is useful in  patients with stable renal function. The eGFR calculation will not be reliable in acutely ill patients when serum creatinine is changing rapidly. It is not useful in patients on dialysis. The eGFR calculation may not be applicable to patients at the low and high extremes of body sizes, pregnant women, and vetetarians.)  Cardiac:  14-Oct-15 23:28   CK, Total 47 (26-192 NOTE: NEW REFERENCE RANGE  11/13/2013)   Radiology Results: XRay:    11-Oct-15 14:17, Chest Portable Single View  Chest Portable Single View   REASON FOR EXAM:    chest pain  COMMENTS:       PROCEDURE: DXR - DXR PORTABLE CHEST SINGLE VIEW  - Jul 22 2014  2:17PM     CLINICAL DATA:  Chest pain.    EXAM:  PORTABLE CHEST - 1 VIEW    COMPARISON:  03/02/2012    FINDINGS:  Heart size is stable andat the upper limits of normal. Both lungs  are clear. No evidence of pleural effusion. Prior CABG noted.  Several broken sternotomy wires again noted.     IMPRESSION:  Stable exam.  No active disease.      Electronically Signed    By: JEarle GellM.D.    On: 07/22/2014 14:22         Verified By: JMarlaine Hind M.D.,  Cardiology:    11-Oct-15 13:34, ED ECG  Ventricular Rate 67  Atrial Rate 67  P-R Interval 134  QRS Duration 94  QT 410  QTc 433  P Axis 46  R Axis 10  T Axis 11  ECG interpretation   Normal sinus rhythm  Minimal voltage criteria for LVH, may be normal variant  Inferior infarct (cited on or before 17-Dec-2009)  Abnormal ECG  When compared with ECG of 02-Mar-2012 05:13,  Sinus rhythm has replaced Atrial flutter  Vent. rate has decreased BY  56 BPM  Non-specific change in ST segment in Inferior leads  ST no longer depressed in Anterolateral leads  T wave inversion no longer evident in Lateral leads  ----------unconfirmed----------  Confirmed by OVERREAD, NOT ([id number]), editor PEARSON, BARBARA ([id number]) on 07/23/2014 12:45:14 PM  ED ECG     11-Oct-15  16:22, ECG  Ventricular Rate 58  Atrial Rate 288  QRS Duration 96  QT 422  QTc 414  R Axis 4  T Axis 11  ECG interpretation   Junctional rhythm  Minimal voltage criteria for LVH, may be normal variant  Inferior infarct (cited on or before 17-Dec-2009)  Abnormal ECG  When compared with ECG of 02-Mar-2012 05:13,  Junctional rhythm has replaced Atrial flutter  Vent. rate has decreased BY  65 BPM  ST no longer depressed in Anterolateral leads  Nonspecific T wave abnormality has replaced inverted T waves in Lateral leads  ----------unconfirmed----------  Confirmed by OVERREAD, NOT ([id number]), editor PEARSON, BARBARA ([id number]) on 07/23/2014 12:45:27 PM  ECG     11-Oct-15 17:08, Echo Doppler  Echo Doppler   REASON FOR EXAM:      COMMENTS:       PROCEDURE: [facility] - ECHO DOPPLER COMPLETE(TRANSTHOR)  - Jul 22 2014  5:08PM     RESULT: Echocardiogram Report    Patient Name:   Laura Duran Date of Exam: 07/22/2014  Medical Rec #:  [id number]           Custom1:  Date of Birth:  30-Jul-1946        Height:       62.0 in  Patient Age:    69 years         Weight:       193.0 lb  Patient Gender: F                BSA:          1.88 m??    Indications: MI  Sonographer:    Arville Go RDCS  Referring Phys: STAFFORD,PHILLIP    Sonographer Comments: Technically difficult study due to poor echo   windows and suboptimal subcostal window.    Summary:   1. Left ventricular ejection fraction, by visual estimation, is 55 to   60%.   2. Normal global left ventricular systolic function.   3. Moderate tricuspid regurgitation.   4. Mildly elevated pulmonary artery systolic pressure.  2D AND M-MODE MEASUREMENTS (normal ranges within parentheses):  Left Ventricle:          Normal  IVSd (2D):      1.19 cm (0.7-1.1)  LVPWd(2D):     1.25 cm (0.7-1.1) Aorta/LA:                  Normal  LVIDd (2D):     4.25 cm (3.4-5.7) Aortic Root (2D): 2.90 cm (2.4-3.7)  LVIDs (2D):     2.97 cm           Left  Atrium (2D): 3.90  cm (1.9-4.0)  LV FS (2D):     30.1 %   (>25%)  LV EF (2D):   57.7 %   (>50%)                                    Right Ventricle:                                    RVd (2D):  LV DIASTOLIC FUNCTION:  MV Peak E: 0.69 m/s Decel Time: 248 msec  MV Peak A: 0.72 m/s  E/A Ratio: 0.95  SPECTRAL DOPPLER ANALYSIS (where applicable):  Mitral Valve:  MV P1/2 Time: 71.92 msec  MV Area, PHT: 3.06 cm??  Aortic Valve: AoV Max Vel: 1.30 m/s AoV Peak PG: 6.8 mmHg AoV Mean PG:  LVOT Vmax: 0.96 m/s LVOT VTI:  LVOT Diameter: 1.80 cm  AoV Area, Vmax: 1.89 cm?? AoV Area, VTI:  AoV Area, Vmn:  Tricuspid Valve and PA/RV Systolic Pressure: TR Max Velocity: 2.69 m/s RA   Pressure: 10 mmHg RVSP/PASP: 38.9 mmHg  Pulmonic Valve:  PV Max Velocity: 0.77 m/s PV Max PG: 2.4 mmHg PV Mean PG:    PHYSICIAN INTERPRETATION:  Left Ventricle: The left ventricular internal cavity size was normal. LV   septal wall thickness was normal. LV posterior wall thickness was normal.   Global LV systolic function was normal. Left ventricular ejection   fraction, by visual estimation, is 55 to 60%.  Left Atrium: The left atrium is normal in size.  Right Atrium: The right atrium is normal in size.  Mitral Valve: The mitral valve is not well seen. Trace mitral valve     regurgitation is seen.  Tricuspid Valve: The tricuspid valve is not well seen.Moderate tricuspid   regurgitation is visualized. The tricuspid regurgitant velocity is 2.69   m/s, and with an assumed right atrial pressure of 10 mmHg, the estimated   right ventricular systolic pressure is mildly elevated at 38.9 mmHg.  Aortic Valve: The aortic valve was not well seen. No evidence of aortic   valve regurgitation is seen.    [place] MD  Electronically signed by [id number] Bartholome Bill MD  Signature Date/Time: 07/23/2014/7:43:33 AM    *** Final ***    IMPRESSION: .    Verified By: Teodoro Spray, M.D., MD    12-Oct-15 06:45, ECG  Ventricular Rate 55  Atrial  Rate 55  P-R Interval 168  QRS Duration 100  QT 448  QTc 428  P Axis 39  R Axis 32  T Axis 44  ECG interpretation   Sinus bradycardia  Otherwise normal ECG  No previous ECGs available  Confirmed by ARIDA, MUHAMMAD ([id number]) on 07/23/2014 1:45:51 PM    Overreader: Kathlyn [place]  ECG     14-Oct-15 13:53, ECG  Ventricular Rate 59  Atrial Rate 59  P-R Interval 172  QRS Duration 94  QT 432  QTc 427  P Axis 63  R Axis 37  T Axis 38  ECG interpretation   Sinus bradycardia  Nonspecific T wave abnormality  Abnormal ECG  When compared with ECG of 23-Jul-2014 06:45,  No significant change was found  ----------unconfirmed----------  Confirmed by OVERREAD, NOT ([id number]), editor PEARSON, BARBARA ([id number]) on 07/26/2014 8:03:17 AM  ECG    Assessment/Plan:  Assessment/Plan:  Assessment  IMP  status post PCI and stent DES to prox LAD  coronary disease  hypertension  hyperlipidemia  peripheral vascular disease  paroxysmal AFib .   Plan PLAN  continue aspirin 81 mg a day  Plavix 75 mg once a day  agree with Eliquis for anticoagulation for AFib  amiodarone therapy for rhythm control  min continue hypertension control  agree with Pravachol for hyperlipidemia  maintained triple therapy for least a month and then consider discontinuing aspirin  after about 12 months for dental probably discontinue Plavix in favor of restarting low-    dose aspirin  consider cardiac rehab  follow-up with Cardiology 1-2 weeks   Electronic Signatures: Lujean Amel D (MD)  (Signed 15-Oct-15 10:22)  Authored: Chief Complaint, VITAL SIGNS/ANCILLARY NOTES, Brief Assessment, Lab Results, Radiology Results, Assessment/Plan   Last Updated: 15-Oct-15 10:22 by Yolonda Kida (MD)

## 2015-02-02 NOTE — Consult Note (Signed)
Chief Complaint:  Subjective/Chief Complaint Patient is status post cardiac catheterization which showed high-grade stenosis of the proximal LAD. patient has had significant known occlusion of bypass grafts except the LIMA which is patent to the mid to distal LAD. patient still has occasional chest pain shortness of breath.   VITAL SIGNS/ANCILLARY NOTES: **Vital Signs.:   13-Oct-15 15:25  Vital Signs Type Routine  Temperature Temperature (F) 97.5  Celsius 36.3  Temperature Source oral  Pulse Pulse 63  Respirations Respirations 18  Systolic BP Systolic BP 627  Diastolic BP (mmHg) Diastolic BP (mmHg) 62  Mean BP 94  Pulse Ox % Pulse Ox % 97  Pulse Ox Activity Level  At rest  Oxygen Delivery Room Air/ 21 %  *Intake and Output.:   Shift 13-Oct-15 15:00  Grand Totals Intake:   Output:  200    Net:  -200 24 Hr.:  -200  Urine ml     Out:  200  Length of Stay Totals Intake:  2535 Output:  1975    Net:  560   Brief Assessment:  GEN well developed, well nourished, no acute distress   Cardiac Regular  murmur present  -- JVD   Respiratory normal resp effort  clear BS   Gastrointestinal Normal   Gastrointestinal details normal Soft  Nontender  Nondistended  No masses palpable   EXTR negative cyanosis/clubbing, negative edema   Lab Results: Cardiology:  12-Oct-15 06:45   Ventricular Rate 55  Atrial Rate 55  P-R Interval 168  QRS Duration 100  QT 448  QTc 428  P Axis 39  R Axis 32  T Axis 44  ECG interpretation Sinus bradycardia Otherwise normal ECG No previous ECGs available Confirmed by Fletcher Anon, MUHAMMAD ([id number]) on 07/23/2014 1:45:51 PM  Overreader: Kathlyn Sacramento  Routine Chem:  12-Oct-15 04:19   Glucose, Serum 96  BUN  24  Creatinine (comp)  2.18  Sodium, Serum 138  Potassium, Serum 3.7  Chloride, Serum 105  CO2, Serum 25  Calcium (Total), Serum  8.1  Anion Gap 8  Osmolality (calc) 280  eGFR (African American)  29  eGFR (Non-African American)  24 (eGFR  values <16m/min/1.73 m2 may be an indication of chronic kidney disease (CKD). Calculated eGFR, using the MRDR Study equation, is useful in  patients with stable renal function. The eGFR calculation will not be reliable in acutely ill patients when serum creatinine is changing rapidly. It is not useful in patients on dialysis. The eGFR calculation may not be applicable to patients at the low and high extremes of body sizes, pregnant women, and vetetarians.)  Cholesterol, Serum 176  Triglycerides, Serum 62  HDL (INHOUSE) 52  VLDL Cholesterol Calculated 12  LDL Cholesterol Calculated  112 (Result(s) reported on 23 Jul 2014 at 05:13AM.)  13-Oct-15 01:06   Glucose, Serum 86  BUN  21  Creatinine (comp)  1.67  Sodium, Serum 137  Potassium, Serum 4.1  Chloride, Serum  111  CO2, Serum 24  Calcium (Total), Serum  8.4  Anion Gap  2  Osmolality (calc) 276  eGFR (African American)  39  eGFR (Non-African American)  32 (eGFR values <660mmin/1.73 m2 may be an indication of chronic kidney disease (CKD). Calculated eGFR, using the MRDR Study equation, is useful in  patients with stable renal function. The eGFR calculation will not be reliable in acutely ill patients when serum creatinine is changing rapidly. It is not useful in patients on dialysis. The eGFR calculation may not be applicable to patients  at the low and high extremes of body sizes, pregnant women, and vetetarians.)  Routine Coag:  13-Oct-15 01:06   Prothrombin 14.0  INR 1.1 (INR reference interval applies to patients on anticoagulant therapy. A single INR therapeutic range for coumarins is not optimal for all indications; however, the suggested range for most indications is 2.0 - 3.0. Exceptions to the INR Reference Range may include: Prosthetic heart valves, acute myocardial infarction, prevention of myocardial infarction, and combinations of aspirin and anticoagulant. The need for a higher or lower target INR must be  assessed individually. Reference: The Pharmacology and Management of the Vitamin K  antagonists: the seventh ACCP Conference on Antithrombotic and Thrombolytic Therapy. DSKAJ.6811 Sept:126 (3suppl): [id number]. A HCT value >55% may artifactually increase the PT.  In one study,  the increase was an average of 25%. Reference:  "Effect on Routine and Special Coagulation Testing Values of Citrate Anticoagulant Adjustment in Patients with High HCT Values." American Journal of Clinical Pathology 2006;126:400-405.)  Routine Hem:  12-Oct-15 04:19   Platelet Count (CBC) 304  WBC (CBC) 7.1  RBC (CBC)  3.37  Hemoglobin (CBC)  10.1  Hematocrit (CBC)  30.8  MCV 92  MCH 30.0  MCHC 32.8  RDW  15.9  Neutrophil % 61.4  Lymphocyte % 27.8  Monocyte % 8.5  Eosinophil % 1.8  Basophil % 0.5  Neutrophil # 4.3  Lymphocyte # 2.0  Monocyte # 0.6  Eosinophil # 0.1  Basophil # 0.0 (Result(s) reported on 23 Jul 2014 at 05:06AM.)  13-Oct-15 01:06   Platelet Count (CBC) 289 (Result(s) reported on 24 Jul 2014 at 01:32AM.)   Radiology Results: XRay:    11-Oct-15 14:17, Chest Portable Single View  Chest Portable Single View   REASON FOR EXAM:    chest pain  COMMENTS:       PROCEDURE: DXR - DXR PORTABLE CHEST SINGLE VIEW  - Jul 22 2014  2:17PM     CLINICAL DATA:  Chest pain.    EXAM:  PORTABLE CHEST - 1 VIEW    COMPARISON:  03/02/2012    FINDINGS:  Heart size is stable andat the upper limits of normal. Both lungs  are clear. No evidence of pleural effusion. Prior CABG noted.  Several broken sternotomy wires again noted.     IMPRESSION:  Stable exam.  No active disease.      Electronically Signed    By: Earle Gell M.D.    On: 07/22/2014 14:22         Verified By: Marlaine Hind, M.D.,  Cardiology:    11-Oct-15 13:34, ED ECG  Ventricular Rate 67  Atrial Rate 67  P-R Interval 134  QRS Duration 94  QT 410  QTc 433  P Axis 46  R Axis 10  T Axis 11  ECG interpretation   Normal  sinus rhythm  Minimal voltage criteria for LVH, may be normal variant  Inferior infarct (cited on or before 17-Dec-2009)  Abnormal ECG  When compared with ECG of 02-Mar-2012 05:13,  Sinus rhythm has replaced Atrial flutter  Vent. rate has decreased BY  56 BPM  Non-specific change in ST segment in Inferior leads  ST no longer depressed in Anterolateral leads  T wave inversion no longer evident in Lateral leads  ----------unconfirmed----------  Confirmed by OVERREAD, NOT ([id number]), editor PEARSON, BARBARA ([id number]) on 07/23/2014 12:45:14 PM  ED ECG     11-Oct-15 16:22, ECG  Ventricular Rate 58  Atrial Rate 288  QRS Duration 96  QT  422  QTc 414  R Axis 4  T Axis 11  ECG interpretation   Junctional rhythm  Minimal voltage criteria for LVH, may be normal variant  Inferior infarct (cited on or before 17-Dec-2009)  Abnormal ECG  When compared with ECG of 02-Mar-2012 05:13,  Junctional rhythm has replaced Atrial flutter  Vent. rate has decreased BY  65 BPM  ST no longer depressed in Anterolateral leads  Nonspecific T wave abnormality has replaced inverted T waves in Lateral leads  ----------unconfirmed----------  Confirmed by OVERREAD, NOT ([id number]), editor PEARSON, BARBARA ([id number]) on 07/23/2014 12:45:27 PM  ECG     11-Oct-15 17:08, Echo Doppler  Echo Doppler   REASON FOR EXAM:      COMMENTS:       PROCEDURE: [facility] - ECHO DOPPLER COMPLETE(TRANSTHOR)  - Jul 22 2014  5:08PM     RESULT: Echocardiogram Report    Patient Name:   Laura Duran Date of Exam: 07/22/2014  Medical Rec #:  [id number]           Custom1:  Date of Birth:  1946/09/01        Height:       62.0 in  Patient Age:    68 years         Weight:       193.0 lb  Patient Gender: F                BSA:          1.88 m??    Indications: MI  Sonographer:    Arville Go RDCS  Referring Phys: STAFFORD,PHILLIP    Sonographer Comments: Technically difficult study due to poor echo   windows and suboptimal subcostal  window.    Summary:   1. Left ventricular ejection fraction, by visual estimation, is 55 to   60%.   2. Normal global left ventricular systolic function.   3. Moderate tricuspid regurgitation.   4. Mildly elevated pulmonary artery systolic pressure.  2D AND M-MODE MEASUREMENTS (normal ranges within parentheses):  Left Ventricle:          Normal  IVSd (2D):      1.19 cm (0.7-1.1)  LVPWd(2D):     1.25 cm (0.7-1.1) Aorta/LA:                  Normal  LVIDd (2D):     4.25 cm (3.4-5.7) Aortic Root (2D): 2.90 cm (2.4-3.7)  LVIDs (2D):     2.97 cm           Left Atrium (2D): 3.90 cm (1.9-4.0)  LV FS (2D):     30.1 %   (>25%)  LV EF (2D):   57.7 %   (>50%)                                    Right Ventricle:                                    RVd (2D):  LV DIASTOLIC FUNCTION:  MV Peak E: 0.69 m/s Decel Time: 248 msec  MV Peak A: 0.72 m/s  E/A Ratio: 0.95  SPECTRAL DOPPLER ANALYSIS (where applicable):  Mitral Valve:  MV P1/2 Time: 71.92 msec  MV Area, PHT: 3.06 cm??  Aortic Valve: AoV Max Vel: 1.30 m/s AoV Peak PG: 6.8 mmHg AoV  Mean PG:  LVOT Vmax: 0.96 m/s LVOT VTI:  LVOT Diameter: 1.80 cm  AoV Area, Vmax: 1.89 cm?? AoV Area, VTI:  AoV Area, Vmn:  Tricuspid Valve and PA/RV Systolic Pressure: TR Max Velocity: 2.69 m/s RA   Pressure: 10 mmHg RVSP/PASP: 38.9 mmHg  Pulmonic Valve:  PV Max Velocity: 0.77 m/s PV Max PG: 2.4 mmHg PV Mean PG:    PHYSICIAN INTERPRETATION:  Left Ventricle: The left ventricular internal cavity size was normal. LV   septal wall thickness was normal. LV posterior wall thickness was normal.   Global LV systolic function was normal. Left ventricular ejection   fraction, by visual estimation, is 55 to 60%.  Left Atrium: The left atrium is normal in size.  Right Atrium: The right atrium is normal in size.  Mitral Valve: The mitral valve is not well seen. Trace mitral valve     regurgitation is seen.  Tricuspid Valve: The tricuspid valve is not well seen.Moderate  tricuspid   regurgitation is visualized. The tricuspid regurgitant velocity is 2.69   m/s, and with an assumed right atrial pressure of 10 mmHg, the estimated   right ventricular systolic pressure is mildly elevated at 38.9 mmHg.  Aortic Valve: The aortic valve was not well seen. No evidence of aortic   valve regurgitation is seen.    Perrin Bend MD  Electronically signed by [id number] Bartholome Bill MD  Signature Date/Time: 07/23/2014/7:43:33 AM    *** Final ***    IMPRESSION: .    Verified By: Teodoro Spray, M.D., MD    12-Oct-15 06:45, ECG  Ventricular Rate 55  Atrial Rate 55  P-R Interval 168  QRS Duration 100  QT 448  QTc 428  P Axis 39  R Axis 32  T Axis 44  ECG interpretation   Sinus bradycardia  Otherwise normal ECG  No previous ECGs available  Confirmed by Fletcher Anon, MUHAMMAD ([id number]) on 07/23/2014 1:45:51 PM    Overreader: Kathlyn Sacramento  ECG    Assessment/Plan:  Assessment/Plan:  Assessment IMP  unstable angina  coronary disease  hypertension  hyperlipidemia  chronic renal insufficiency  atrial fibrillation  peripheral vascular disease  status post cardiac catheterization with significant disease in the LAD  GERD  anxiety .   Plan PLAN  recommend PCI and stent of proximal LAD in the am  will states procedure because of significant renal insufficiency and dye load  recommend renal protection prior to states procedure  continue anticoagulation  hypertension therapy  statin therapy for hyperlipidemia  continue therapy for reflux  referred the patient and Nephrology for evaluation and therapy renal insufficiency  PCI and stent no morning   Electronic Signatures: Lujean Amel D (MD)  (Signed 14-Oct-15 11:36)  Authored: Chief Complaint, VITAL SIGNS/ANCILLARY NOTES, Brief Assessment, Lab Results, Radiology Results, Assessment/Plan   Last Updated: 14-Oct-15 11:36 by Yolonda Kida (MD)

## 2015-02-02 NOTE — Discharge Summary (Signed)
PATIENT NAME:  Laura Duran, Laura Duran MR#:  [id number] DATE OF BIRTH:  1946-10-03  DATE OF ADMISSION:  07/22/2014 DATE OF DISCHARGE:  07/26/2014  DISCHARGE DIAGNOSES:  1.  Unstable angina, status post percutaneous coronary intervention with drug-eluting stent to proximal left anterior descending artery.  2.  Coronary artery disease.  3.  Hypertension.  4.  Hyperlipidemia.  5.  Peripheral vascular disease.  6.  Paroxysmal atrial fibrillation.   CONSULTS: Dwayne D. Juliann Paresallwood, MD with cardiology.   PROCEDURES: Cardiac catheterization, which showed blockage of the proximal LAD, which had a drug-eluting stent placed and diffuse disease of the RCA, which was not thought to be the culprit of her symptoms.   ADMITTING HISTORY AND PHYSICAL: Please see detailed H and P dictated by Dr. Cheron SchaumannGouro. In brief, this is a 69 year old female patient with chronic angina brought into the hospital complaining of new onset chest pain. The patient was found to have a mildly elevated troponin of  0.10. She was admitted to the hospitalist service.   HOSPITAL COURSE: The patient was on a telemetry floor. She had 2 more sets of cardiac enzymes checked with troponin being stable, but considering her symptoms, the patient was taken to the catheterization lab by Dr. Juliann Paresallwood. She had cardiac catheterization done twice, initially diagnostic and the second cardiac catheterization had a drug-eluting stent placed to the proximal LAD. The patient's symptoms have improved significantly. On discussing with Dr. Juliann Paresallwood, he advised that the patient be on aspirin, Plavix, beta blocker and statin, which were continued. The patient feels better, and is being discharged home in a stable condition.   Prior to discharge, the patient's lungs sound clear. S1, S2 heard. No edema. Has ambulated without any symptoms.   DISCHARGE MEDICATIONS:  1.  Aspirin 81 mg daily.  2.  Coreg 25 mg oral 2 times a day.  3.  Alprazolam 0.5 mg oral 2 times a day as  needed.  4.  Folic acid 1 mg daily.  5.  Lasix 20 mg daily.  6.  Hydralazine 25 mg oral 3 times a day.  7.  Imdur 60 mg oral 2 times a day.  8.  Ranexa 5 mg oral 2 times a day.  9.  Eliquis 5 mg 2 times a day.  10.  Pravastatin 20 mg once a day.  11.  Amiodarone 200 mg once a day.  12.  Losartan 50 mg oral once a day.   DISCHARGE INSTRUCTIONS: Low-sodium, low-fat diet. Activity as tolerated. Follow up with Dr. Juliann Paresallwood in 1-2 weeks.   TIME SPENT ON DAY OF DISCHARGE IN DISCHARGE ACTIVITY: 40 minutes    ____________________________ Molinda BailiffSrikar R. Ardeth Repetto, MD srs:MT D: 07/30/2014 13:58:00 ET T: 07/30/2014 16:04:32 ET JOB#: [id number]  WJ:[id number]:Laura Norrington R. Donzel Romack, MD, <Dictator> Dwayne D. Juliann Paresallwood, MD Serita ShellerErnest B. Maryellen PileEason, MD Orie FishermanSRIKAR R Almarie Kurdziel MD ELECTRONICALLY SIGNED 08/08/2014 10:44

## 2015-02-02 NOTE — Consult Note (Signed)
PATIENT NAME:  Laura Duran, Laura Duran MR#:  [id number] DATE OF BIRTH:  1946-04-24  DATE OF CONSULTATION:  07/23/2014  REFERRING PHYSICIAN:   CONSULTING PHYSICIAN:  Laura Duran D. Juliann Paresallwood, MD  PRIMARY PHYSICIAN:  Dr. Maryellen PileEason.    INDICATION: Unstable angina.   HISTORY OF PRESENT ILLNESS: [name] is a 69 year old female with multiple medical problems including coronary artery disease, hypertension, hyperlipidemia, coronary bypass surgery, peripheral vascular disease, atrial fibrillation, GERD, COPD, anxiety, rheumatoid arthritis, gout, who presented with recurrent chest pain symptoms. She had recently been seen in the office with paroxysmal atrial fibrillation, placed on amiodarone for rhythm control and Eliquis for anticoagulation.  During the course of taking Eliquis she thought it made her feel funny, started having chest pressure, quit taking the Eliquis and came to the Emergency Room for evaluation. Given aspirin and nitroglycerin for the chest pressure which is anterior, moderate in nature, no radiation. She had similar discomfort in the past. Troponins were flat. She was given Lovenox, found to have renal insufficiency which is chronic and advised to be admitted for further evaluation and care.  At time of admission her rhythm was sinus bradycardia.   PAST MEDICAL HISTORY: Coronary artery disease, myocardial infarction, abdominal aortic aneurysm, GERD, COPD, renal insufficiency, decubitus ulcer, anemia, dyslipidemia, hypertension, rheumatoid arthritis, gout, anxiety, peripheral vascular disease, mild obesity.   PAST SURGICAL HISTORY: Hysterectomy, coronary artery bypass surgery, BKA on the left, PCI and stent.   ALLERGIES: None.   SOCIAL HISTORY: Lives with husband, retired. Quit smoking 15 years ago. No alcohol consumption.   FAMILY HISTORY: Heart disease, peripheral vascular disease, hyperlipidemia, diabetes, arthritis.   MEDICATIONS: Ranexa 500 twice a day, prednisone 5 mg a day, nitroglycerin  sublingual p.r.n., Imdur 60 a day, hydralazine 25 three times a day, Lasix 20 mg a day, folic acid once a day, Uloric 40 mg once a day, Cozaar 50 mg once a day, Plavix 75 a day, Coreg 25 twice a day, aspirin 81 mg a day, prednisone 2.5 twice a day p.r.n., allopurinol 100 mg once a day.   REVIEW OF SYSTEMS: No blackout spells or syncope. No nausea or vomiting. No fever. No chills. No sweats. No weight loss or weight gain. No hemoptysis or hematemesis. No bright red blood per rectum. No vision change or hearing change. No sputum production. Denies any cough. Complains of chest congestion and palpitations she thinks may be related to the Eliquis.  No bleeding.   PHYSICAL EXAMINATION:  VITAL SIGNS: Blood pressure 150/60, pulse of 60, respiratory rate of 14, afebrile.  HEENT: Normocephalic, atraumatic. Pupils equal, reactive to light.  NECK: Supple. No significant JVD, bruits, or adenopathy.  LUNGS: Clear to auscultation and percussion. No significant wheeze, rhonchi, or rales.  HEART: Bradycardic, regular. Systolic ejection murmur at left sternal border. Positive S4. PMI nondisplaced.  ABDOMEN: Benign.  EXTREMITIES: Within normal limits except for BKA on the left.  NEUROLOGIC: Intact.  SKIN: Normal.   LABORATORIES: Glucose 140, BUN of 19, creatinine 1.78, potassium was normal, chloride of 108, CO2 of 25, osmolarity and calcium were normal. Troponin 0.1. White count 8.7, hemoglobin 10, hematocrit 31.7, platelet count of 303,000. Chest x-ray negative.  EKG, sinus rhythm, bradycardic, nonspecific ST-T changes.   ASSESSMENT:  1.  Unstable angina.   2.  Coronary disease.   3.  Hyperlipidemia.   4.  Hypertension.  5.  Chronic obstructive pulmonary disease.  6.  Renal insufficiency.    7.  Anemia.  8.  Rheumatoid arthritis.  9.  Peripheral  vascular disease.   10.  Anxiety.  11.  Atrial fibrillation.   PLAN:   1.  Agree with admit, rule out for myocardial infarction, followup cardiac enzymes,  followup EKGs, follow up troponins on telemetry. Continue anticoagulation short-term.  2.  Echocardiogram may be helpful.  3.  Continue hypertension control with usual medications.  4.  Renal insufficiency. We will follow up BUN and creatinine. Consider bicarbonate as well as hydration.   5.  Because of coronary artery disease, coronary bypass surgery, unstable angina, recommend cardiac catheterization at some point. Continue anemia therapy.  6.  Rheumatoid arthritis therapy with prednisone, peripheral vascular disease.   7.  We will probably proceed with cardiac catheterization and base further evaluation on the results of this study.     ____________________________ Bobbie Stack Juliann Pares, MD ddc:bu D: 07/23/2014 13:13:33 ET T: 07/23/2014 13:58:01 ET JOB#: [id number]  cc: Laura Dungan D. Juliann Pares, MD, <Dictator> Alwyn Pea MD ELECTRONICALLY SIGNED 08/15/2014 10:52

## 2015-02-03 NOTE — Discharge Summary (Signed)
PATIENT NAME:  Laura JourdainHOOPER, [place]el[place] E MR#:  [id number] DATE OF BIRTH:  13-Feb-1946  DATE OF ADMISSION:  11/30/2011 DATE OF DISCHARGE:  12/01/2011  HISTORY AND PHYSICAL: For a detailed note, please take a look at the History and Physical done by Dr. Margie EgePhichith at admission.   DISPOSITION: The patient is being discharged to [facility] for further evaluation and treatment regarding this patient's severe coronary artery disease.   DIAGNOSES AT DISCHARGE:  1. Severe native coronary disease with a history of coronary artery bypass graft.  2. History of abdominal aortic aneurysm. 3. Gastroesophageal reflux disease. 4. Chronic obstructive pulmonary disease with history of prior tobacco abuse. 5. Chronic kidney disease, stage III.  6. History of dyslipidemia. 7. Hypertension. 8. Rheumatoid arthritis. 9. Anxiety/panic disorder.  10. 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  11. Atrial fibrillation.  PAST SURGICAL HISTORY:  1. Hysterectomy. 2. Coronary artery bypass graft surgery. 3. Left below-knee amputation.   CURRENT MEDICATIONS:  1. Nitroglycerin drip at 10 mcg/min.  2. Tylenol 650 every 4 hours p.r.n.  3. Aspirin 81 mg daily.  4. Morphine 1 to 2 mg IV every 4 hours p.r.n.  5. Zofran 4 mg every 4 hours p.r.n.  6. Protonix 40 mg daily.  7. Xanax 0.5 mg b.i.d. p.r.n. anxiety. 8. Losartan 50 mg b.i.d.  9. Pravachol 10 mg at bedtime. 10. Atenolol 50 mg b.i.d.  11. Imdur 30 mg daily.  12. Methylprednisolone 4 mg b.i.d. x1 day, then 4 mg daily x1 day, then discontinue. 13. Amiodarone 200 mg daily.   CONSULTANTS DURING THE HOSPITAL COURSE: Dr. Dorothyann Pengwayne Callwood from Cardiology.   PERTINENT LABORATORY, DIAGNOSTIC AND RADIOLOGICAL DATA: (During hospital course) Chest x-ray was done on admission showing no acute cardiopulmonary disease.  A cardiac catheterization was done on 11/30/2011 showing multivessel coronary  artery disease with 100% stenosis of the graft to the RCA. Ejection fraction was moderately depressed with ejection fraction of 35 to 40%. Bilateral 50% renal artery stenosis.    HOSPITAL COURSE: The patient is a 69 year old female with medical problems as mentioned above who presented to the hospital in 11/30/2011 complaining of chest pain.   1. Chest pain/non-ST-elevation myocardial infarction: The patient presented to the hospital with chest pain very suspicious and worrisome for unstable angina. The patient was started on aspirin beta blocker, statin, and also maintained on a heparin nomogram. The patient was seen in consultation by Dr. Dorothyann Pengwayne Callwood from Cardiology. Given the fact that the patient had had a history of CABG and ongoing chest pain, she underwent a cardiac catheterization which showed significant native vessel coronary disease with also a new occluded graft to the RCA. She has had another graft to the LAD which has also been previously 100% stenosed. Given the fact of her coronary disease, it was recommended that we manage her medically. She was started on Imdur, along with maintenance of her other medications as mentioned. Despite being on new medications, the patient continued to have ongoing chest pain, at which point she is currently being started on a nitroglycerin drip. Given her significant complicated coronary disease and possible need for further revascularization, she is currently being transferred to [facility] for further treatment. She currently remains hemodynamically stable.  2. Acute renal failure: This was acute on chronic renal failure. The patient presented with a creatinine of 1.9. She did receive aggressive IV fluid hydration. Her creatinine on February 19th is down to as low  as 1.45. She did not receive any Mucomyst or anything else for contrast prophylaxis. She was given a lower dye load.  3. Atrial fibrillation: When the patient originally presented to  the hospital, she was noted to be tachycardic. She was noted to be in supraventricular tachycardia, possible atrial fibrillation/flutter. She was continued on her beta blockers as per Cardiology. Amiodarone was also added. Shortly after being on amiodarone, the patient converted to a sinus rhythm and actually became somewhat bradycardic. Currently she remains in a normal sinus rhythm but bradycardic. Her heart rate is being controlled on atenolol b.i.d., and amiodarone dose has been lowered. If she continues to have possible paroxysmal atrial fibrillation, she likely might benefit from long-term anticoagulation, but this can be further evaluated while she is at [facility].  4. Hypertension: The patient remained hemodynamically stable on her losartan and her atenolol, which she will continue.  5. Hyperlipidemia: The patient was maintained on her Pravachol, and she will resume and continue that.  6. Anxiety/panic disorder: The patient was maintained on her Xanax, and she will continue that.  7. History of rheumatoid arthritis and gouty arthritis: The patient currently is on a methylprednisolone taper. She will finish doing that. She has two more days left.  8. Leukocytosis: This was initially thought to be possibly related to a non-ST-elevation myocardial infarction, also complicated with the use of high-dose steroids. Her white cell count further needs to be followed up. She did not have any infectious source. Her urinalysis was negative, and a chest x-ray did not show any evidence of any acute cardiopulmonary disease or pneumonia.   The patient, as mentioned, is going to be transferred to [facility] for further evaluation of her ongoing angina and complicated coronary artery disease as mentioned.    TIME SPENT WITH DISCHARGE:  40 minutes.   ____________________________ Rolly Pancake. Cherlynn Kaiser, MD vjs:cbb D: 12/01/2011 18:55:22 ET T: 12/01/2011 19:09:29 ET JOB#: [id number]  cc: Rolly Pancake.  Cherlynn Kaiser, MD, <Dictator> Houston Siren MD ELECTRONICALLY SIGNED 12/02/2011 15:56

## 2015-02-03 NOTE — H&P (Signed)
PATIENT NAME:  Laura Duran, Laura Duran MR#:  [id number] DATE OF BIRTH:  1946/09/27  DATE OF ADMISSION:  11/30/2011  REFERRING PHYSICIAN: Dr. Mayford KnifeWilliams   PRIMARY PHYSICIAN: Dr. Maryellen PileEason   PRIMARY CARDIOLOGIST: Dr. Juliann Paresallwood   PRESENTING COMPLAINT: Chest pain and tooth pain.   HISTORY OF PRESENT ILLNESS: Laura Duran is a pleasant 69 year old woman with history of coronary artery disease status post MI, bypass, and stenting, history of chronic kidney disease, peripheral vascular disease status post left BKA, hypertension, and hyperlipidemia who presents with reports of developing chest heaviness and tightness across the center of her chest after finishing dinner. Denies any radiation to her arm. She reports pain throbbing in her left wisdom tooth similar to symptoms of her past MI. Denies any nausea or vomiting. She reports development of chills up her back. No shortness of breath. She did not notice palpitations, presyncope, or syncope. She is currently chest pain free. On initial arrival she was found to be tachycardic with heart rate of 144 and hypertensive with blood pressure of 180/110.   PAST MEDICAL HISTORY:  1. Coronary artery disease status post MI infarction, bypass, and stenting. Last catheterization in May of 2009 EF of 65%, graft to LAD had 90% stenosis in the proximal third of the graft. There is moderate to severe abdominal aortic aneurysm. Mid LAD had 50% and 20% stenosis, second obtuse marginal had 95% stenosis, third obtuse marginal had 75% stenosis at the distal margin of the stented segment at the site of a prior stent, right mid RCA had 100% stenosis. There is 100% stenosis of the graft at the LAD. Graft at the LIMA had 95% stenosis in the proximal third of the graft. Graft of the third obtuse marginal is a saphenous vein graft and there was 100% stenosis at the proximal anastomosis site. Graft of the right PDA was a saphenous vein graft from the aorta. There was a 90% stenosis of the proximal  anastomosis. There was a 60% stenosis of the mid third of the graft.  2. Abdominal aortic aneurysm as above.  3. Gastroesophageal reflux disease.  4. Chronic obstructive pulmonary disease with prior tobacco use.  5. Chronic kidney disease stage III, baseline appears to be around 1.5.  6. History of decubitus ulcer.  7. Anemia of chronic disease.  8. Dyslipidemia.  9. Hypertension.  10. Rheumatoid arthritis.  11. Gouty arthritis.  12. Anxiety/panic disorder.  13. Peripheral vascular disease status post below-the-knee amputation in 1998.  14. Obesity.  15. History of fracture of the right upper arm.   PAST SURGICAL HISTORY:  1. Hysterectomy.  2. Bypass.  3. BKA of left lower extremity.   ALLERGIES: No known drug allergies.   MEDICATIONS:  1. Methylprednisolone pack. She is on day four with 4 mg at breakfast, lunch, and bedtime; for day five 4 mg at breakfast and bedtime, and for day six 4 mg at breakfast.  2. Aspirin 81 mg daily.  3. Losartan/HCT 50/12.5 one tablet b.i.d.  4. Pravastatin 10 mg daily.  5. Alprazolam 0.5 mg b.i.d. as needed.  6. Atenolol 100 mg b.i.d.   FAMILY HISTORY: High blood pressure and heart disease. Her sister has heart disease. Father died from heart disease. Mother had diabetes and heart disease.   SOCIAL HISTORY: [place] in [place] with her husband. 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 Quit tobacco 15 years ago.   REVIEW OF SYSTEMS: CONSTITUTIONAL: No fevers. Endorses chills. EYES: No visual disturbance. ENT: No epistaxis or discharge. RESPIRATORY: No cough, wheezing,  hemoptysis, shortness of breath. CARDIOVASCULAR: As per history of present illness. GI: No nausea, vomiting, diarrhea, abdominal pain, hematemesis. GU: No dysuria or hematuria. ENDOCRINE: No polyuria or polydipsia. HEME: No easy bleeding. She has easy bruising. SKIN: No ulcers. MUSCULOSKELETAL: Had recent gouty/RA flare, now on steroid taper. NEUROLOGIC: No history of strokes or seizures.  PSYCH: She has history of anxiety. Denies any suicidal ideation.   PHYSICAL EXAMINATION:   VITAL SIGNS: Temperature 96.9, pulse initially 144, current pulse in the 113's, respiratory rate 20, blood pressure 180/110, current blood pressure 117/70, sating at 99% on room air.   GENERAL: Lying in bed in no apparent distress.   HEENT: Normocephalic, atraumatic. Pupils are equal, nonicteric. Nasal cannula in place. Moist mucous membrane.   NECK: Soft and supple. No adenopathy or JVP.   CARDIOVASCULAR: Slightly tachy. No murmurs, rubs, or gallops.   LUNGS: Clear to auscultation. No use of accessory muscles or increased respiratory effort.   ABDOMEN: Soft, nontender. Positive bowel sounds.   EXTREMITIES: Trace edema of right lower extremity. Dorsal pedis pulses faint. She is status post left BKA.   NEUROLOGIC: She has poor squeeze due to pain and swelling of her hand.    PSYCH: She is alert and oriented. The patient is cooperative.   PERTINENT LABS AND STUDIES: Troponin 0.04. CK 22. MB less than 0.5. INR 1.1. PTT 27.5. WBC 16.4, hemoglobin 11.6, hematocrit 35.8, platelets 590, MCV 83, glucose 205, BUN 51, creatinine 1.93, sodium 136, potassium 4.1, chloride 100, carbon dioxide 24, calcium 9.6, total bilirubin 0.2, alkaline phosphatase 102, ALT 13, AST 14, total protein 9.2, albumin 3.3. EKG with sinus tachy, rate of 144. No ST elevation or depression. Chest x-ray without infiltrate.   ASSESSMENT AND PLAN: Laura Duran is a 69 year old woman with history of coronary artery disease status post bypass and stenting, gouty arthritis, rheumatoid arthritis, gastroesophageal reflux disease, chronic obstructive pulmonary disease, chronic kidney disease, and peripheral vascular disease presenting with chest pain.  1. Angina, unstable. Reports symptoms similar to past MI concerning for acute coronary syndrome. Her troponin is high normal. Will start her on heparin drip. Currently chest pain free. She has poor  IV access. If loses IV, need to have a central line placed. Continue on tele. Cycle cardiac enzymes. Start aspirin, Plavix, statin, atenolol, O2, nitroglycerin sublingual as needed. Send TSH, A1c, fasting lipid panel for risk stratification. Obtain Cardiology consultation.  2. Hypertension, accelerated likely in the setting of pain now controlled status post Diltiazem and Lopressor in the ED. Restart atenolol, losartan. Hold HCTZ portion.  3. Chronic kidney disease stage III, baseline creatinine around 1.5, presenting creatinine of 1.9. As above, holding HCTZ portion.  4. Sinus tachycardia again likely in the setting of pain, improved status post Diltiazem and Lopressor. Restart her atenolol.  5. Peripheral vascular disease. Aspirin, starting on Plavix as above.  6. Hyperglycemia. No prior diagnosis of diabetes. Will send A1c.  7. Leukocytosis likely with stressed state. Send urinalysis.  8. Rheumatoid arthritis/gouty arthritis, recently started on methylprednisolone taper. Will complete while here.  9. Prophylaxis. On heparin drip, aspirin, and Protonix.   TIME SPENT: Approximately 50 minutes spent on patient care.   ____________________________ Reuel Derby, MD ap:drc D: 11/30/2011 00:50:24 ET T: 11/30/2011 05:56:58 ET JOB#: [id number]  cc: Pearlean Brownie Oluwanifemi Susman, MD, <Dictator> Serita Sheller. Maryellen Pile, MD Reuel Derby MD ELECTRONICALLY SIGNED 12/08/2011 0:46

## 2015-02-03 NOTE — H&P (Signed)
PATIENT NAME:  Laura Duran, Laura Duran MR#:  [id number] DATE OF BIRTH:  [birth year]  DATE OF ADMISSION:  03/02/2012  PRIMARY CARE PHYSICIAN: Dr. Toy CookeyErnest Eason  REFERRING PHYSICIAN: Dr. Marilynne HalstedBoland  CHIEF COMPLAINT: Chest pain.   HISTORY OF PRESENT ILLNESS: [name] is a 69 year old female with significant past medical history of coronary artery disease status post non-ST elevated myocardial infarction in February of this year where she had cardiac catheterization done, did show multiple vessel disease. Thereafter she was transferred to [facility] for [facility] management. Patient had repeat cardiac catheterization done over there as well where decision was made to manage her medically where she did not get any intervention over there. Patient reports she has been free since then. She saw Dr. Juliann Paresallwood before two weeks without any complaints. Patient reports at 3:00 a.m. she woke up with chest pain radiating to the jaw. She denies any palpitations, shortness of breath, any diaphoresis. Upon presentation to ED patient was hypertensive. Patient was in atrial flutter with RVR. She is known to have history of atrial fibrillation which is chronic. Patient's atrial flutter improved after IV Cardizem push. Patient's EKG did not show any significant changes from previous, but she had troponin of 0.1 so she was started on heparin drip for non-ST elevated myocardial infarction. As well she continued to have chest pain despite receiving three of sublingual nitroglycerin so she will be started on nitro drip as well. Patient had worsening renal failure. She has baseline chronic kidney disease with creatinine of 1.5 to 1.6. She was 1.9 on presentation today. Patient denies any worsening edema, any orthopnea supine or exertional.   PAST MEDICAL HISTORY:  1. Coronary artery disease status post MI. She had bypass and stenting. Last cardiac catheterization was done February of this year which did show multivessel significant disease done by Dr.  Juliann Paresallwood. 2. Abdominal aortic aneurysm.   3. Gastroesophageal reflux disease.  4. Chronic obstructive pulmonary disease.  5. Chronic kidney disease stage III, baseline creatinine around 1.5.  6. History of decubitus ulcer.  7. Anemia of chronic disease.  8. Dyslipidemia.  9. Hypertension.  10. Rheumatoid arthritis.  11. Gout.  12. Anxiety/panic disorder.  13. Peripheral vascular disease, status post below-knee amputation in 1998 of the left lower extremity.  14. Obesity.  15. History of fracture in right upper arm.   PAST SURGICAL HISTORY:  1. Hysterectomy.  2. Bypass.  3. Below-knee amputation of left lower extremity.   ALLERGIES: No known drug allergies.   MEDICATIONS:  1. Allopurinol 100 mg daily. 2. Alprazolam 0.5 as needed b.i.d.  3. Aspirin 81 mg daily.  4. Coreg 25 mg 2 times a day.  5. Plavix 75 mg daily.  6. Febuxostat 40 mg oral daily.  7. Folic acid 1 mg daily.  8. Lasix 20 mg daily.  9. Hydralazine 25 mg 3 times a day.  10. Hydrochlorothiazide/losartan 12.5/50 b.i.d.  11. Isosorbide mononitrate 60 mg oral daily.  12. Pravastatin 10 mg daily.  13. Prednisone 5 mg oral daily, started last week. This is for gout, to tapered soon.    FAMILY HISTORY: Significant for high blood pressure and heart disease. Father died from heart disease. Mother has diabetes and heart disease.   SOCIAL HISTORY: [place] in [place]. Retired from [employer]. Quit smoking 15 years ago.   REVIEW OF SYSTEMS: CONSTITUTIONAL: Patient denies any fever. Complains of fatigue, generalized weakness. EYES: Denies any blurry vision, double vision, eye pain. ENT: Denies any tinnitus, ear pain, hearing loss.  RESPIRATORY: Denies any cough, wheezing, hemoptysis, dyspnea. CARDIOVASCULAR: Complains of chest pain radiating to the left jaw. Has mild edema. Denies any palpitations, syncope. GASTROINTESTINAL: Denies any nausea, vomiting, diarrhea, abdominal pain. GENITOURINARY: Denies any  dysuria, hematuria, renal colic. ENDO: Denies any polyuria, polydipsia, heat or cold intolerance. INTEGUMENTARY: Denies any itching or rash. NEURO: Denies any numbness. Has generalized weakness. Has history of gout with recent flare in upper extremity. PSYCH: Has history of anxiety.   PHYSICAL EXAMINATION:  VITAL SIGNS: Temperature 98.3, pulse 76, respiratory rate 20, blood pressure 204/108, pulse ox 98% on 2 liters nasal cannula.   GENERAL: Elderly female, looks comfortable, no apparent distress.   HEENT: Head atraumatic, normocephalic. Pupils equal, reactive to light. Pink conjunctivae. Anicteric sclerae. Moist oral mucosa.   NECK: Supple. No thyromegaly. No JVD.   CHEST: Patient had good air entry bilaterally. No wheezing, rales, rhonchi.   CARDIOVASCULAR: S1, S2 heard. No rubs, murmur, gallops.   ABDOMEN: Obese, soft, nontender, nondistended. Bowel sounds present.   EXTREMITIES: Right lower extremity edema. Left leg below below-knee amputation with prosthesis.   PSYCHIATRIC: Appropriate affect. Awake, alert x3. Intact judgment and insight.   NEUROLOGIC: Nerves grossly intact. No focal deficits.   LABORATORY, DIAGNOSTIC AND RADIOLOGICAL DATA: Glucose 96, creatinine 1.98, BUN 59, sodium 135, potassium 3.5, chloride 97, CO2 27, anion gap 11, troponin 0.1, white blood cells 11.2, hemoglobin 10.3, hematocrit 32.2, platelets 365. INR 0.9.    ASSESSMENT AND PLAN:  1. Chest pain. This is most likely due to acute coronary syndrome. Patient has history of recent non-ST elevated myocardial infarction in February of this year. Has multivessel disease on recent catheterization. Discussed with Dr. Juliann Pares. Will continue patient on heparin drip. She already got 325 of aspirin, will continue her on baby aspirin daily. Will continue her on Plavix, statin, beta blocker, ARB. As patient continued to have chest pain despite being on sublingual nitroglycerin will start her on nitro drip.  2. Atrial  flutter/atrial fibrillation with rapid ventricular response. Improved after patient received IV Cardizem. Currently patient rate controlled. Will continue with Coreg 25 p.o. b.i.d.  3. Acute on chronic renal failure. Will hold hydrochlorothiazide. Will hold Lasix and will continue her on losartan but will monitor her BMP closely. If her creatinine continues to increase will discontinue her losartan.  4. Gout. Will continue patient on prednisone but should be tapered within one week.  5. Hypertension, uncontrolled. Will continue patient on beta blocker. Will have her on p.r.n. hydralazine and will continue her on losartan. She will be on nitro drip  6. Hyperlipidemia. Continue with statin.  7. CODE STATUS: Patient is FULL CODE.   TOTAL TIME SPENT ON PATIENT CARE: 60 minutes.   ____________________________ Starleen Arms, MD dse:cms D: 03/02/2012 08:22:51 ET T: 03/02/2012 08:41:32 ET JOB#: [id number]  cc: Starleen Arms, MD, <Dictator> Serita Sheller. Maryellen Pile, MD Julianne Chamberlin Teena Irani MD ELECTRONICALLY SIGNED 03/18/2012 0:45

## 2015-02-03 NOTE — Consult Note (Signed)
PATIENT NAME:  Laura Duran, Myrlene E MR#:  [id number] DATE OF BIRTH:  [birth year]  DATE OF CONSULTATION:  03/01/2012  REFERRING PHYSICIAN:  Toy CookeyErnest Eason, MD/Prime Doc  CONSULTING PHYSICIAN:  Dwayne D. Callwood, MD  INDICATION: Chest pain, unstable angina.   HISTORY OF PRESENT ILLNESS: Laura Duran is a 69 year old African American female with multiple medical problems including coronary artery disease, coronary bypass, myocardial infarction, recent non-Q-wave myocardial infarction in February, unstable angina, abdominal aortic aneurysm, reflux, peripheral vascular disease, COPD, chronic renal insufficiency, anemia, hyperlipidemia, rheumatoid arthritis, gout, panic disorder, and obesity. She recently had occlusion of a bypass graft, had a cardiac cath and deemed to be treated medically. [facility] pain but she continued medical management. The patient improved and has been doing reasonably well until recently when she started having recurrent chest pain which woke her up, 7 out of 10, borderline troponin. She was brought to the Emergency Room for evaluation and subsequently was advised to be admitted for further evaluation and therapy. She had atrial flutter which improved with Cardizem.   REVIEW OF SYSTEMS: No blackout spells or syncope. No nausea or vomiting. No fever. No chills. No sweats. No weight loss. No weight gain. No hemoptysis or hematemesis. No bright red blood per rectum.   PAST MEDICAL HISTORY:  1. Coronary artery disease. 2. Unstable angina. 3. Myocardial infarction. 4. Abdominal aortic aneurysm.   5. Reflux.  6. Chronic obstructive pulmonary disease. 7. Renal insufficiency. 8. Chronic anemia. 9. Dyslipidemia. 10. Hypertension. 11. Rheumatoid arthritis. 12. Gout.   13. Anxiety.  14. Peripheral vascular disease. 15. Obesity.   PAST SURGICAL HISTORY:  1. Hysterectomy.  2. Bypass surgery. 3. Angioplasty and stent. 4. BKA on the  right.  MEDICATIONS: 1. Allopurinol 100 mg a day. 2. Alprazolam 0.5 twice a day.  3. Aspirin 81 mg a day.  4. Coreg 25 twice a day.  5. Plavix 75 a day.  6. Febuxostat 40 mg a day.  7. Folic acid once a day.  8. Lasix 20 a day.  9. Hydralazine 25 three times a day.  10. HCTZ/losartan 12.5/50 twice a day.  11. Imdur 60 a day.  12. Pravachol 10 a day.  13. Prednisone 5 mg daily   FAMILY HISTORY: Hypertension, heart disease, diabetes.   SOCIAL HISTORY: Married, children, disabled. No recent smoking or alcohol consumption.   PHYSICAL EXAMINATION:   VITAL SIGNS: Blood pressure 200/100, pulse 75 and irregular, respiratory rate 18, afebrile.   HEENT: Normocephalic, atraumatic. Pupils equal and reactive to light.   NECK: Supple. No JVD.    LUNGS: Clear to auscultation and percussion. No significant wheeze, rhonchi, or rales.   HEART: Regular rate and rhythm. Positive S4. Systolic ejection murmur left sternal border.   ABDOMEN: Benign.   EXTREMITIES: Within normal limits. She has a right BKA.   NEUROLOGIC: Intact.   SKIN: Normal.   LABORATORY, DIAGNOSTIC, AND RADIOLOGICAL DATA: Glucose 96, creatinine 1.98, BUN 59, sodium 135, potassium 3.5, chloride 95, CO2 27. Troponin 0.1. White count 11, hemoglobin 10, hematocrit 32, platelet count 367. INR 0.9.   ASSESSMENT:  1. Unstable angina.  2. Chest pain. 3. Angina. 4. History of non-Q-wave myocardial infarction.  5. Hyperlipidemia.  6. Gastroesophageal reflux disease. 7. Mild obesity.  8. Chronic obstructive pulmonary disease. 9. Gout.   10. Hyperlipidemia.   PLAN:  1. Recommend further evaluation.  2. Continue current therapy.  3. Continue atrial fibrillation therapy for rate control.  4. Continue current  therapy for renal insufficiency and COPD.  5. Recommend continued treatment for gout.  6. Will consider continued therapy. Will hopefully treat the patient medically and not proceed with cardiac cath for now. Hopefully  the patient will continue current therapy.  7. Will continue to treat the patient for now. Hopefully this may be noncardiac and treat the patient medically for now. ____________________________ Bobbie Stack. Juliann Pares, MD ddc:drc D: 03/03/2012 11:30:00 ET T: 03/03/2012 15:07:45 ET JOB#: [id number] DWAYNE D CALLWOOD MD ELECTRONICALLY SIGNED 03/05/2012 9:40

## 2015-02-03 NOTE — Consult Note (Signed)
Chief Complaint:   Subjective/Chief Complaint Pt still having some mild mod cp. Her troponins are slighty up.   VITAL SIGNS/ANCILLARY NOTES: **Vital Signs.:   23-May-13 09:35   Vital Signs Type Routine   Temperature Temperature (F) 98.2   Celsius 36.7   Temperature Source oral   Pulse Pulse 65   Respirations Respirations 23   Systolic BP Systolic BP 956   Diastolic BP (mmHg) Diastolic BP (mmHg) 56   Mean BP 81   Pulse Ox % Pulse Ox % 98   Pulse Ox Activity Level  At rest   Oxygen Delivery Room Air/ 21 %   Pulse Ox Heart Rate 76  *Intake and Output.:   Daily 23-May-13 07:00   Grand Totals Intake:  633.8 Output:  525    Net:  108.8 24 Hr.:  108.8   Oral Intake      In:  50   IV (Primary)      In:  415.8   IV (Primary)      In:  168   Urine ml     Out:  525   Length of Stay Totals Intake:  633.8 Output:  525    Net:  108.8   Brief Assessment:   Cardiac Regular  murmur present  -- carotid bruits  -- LE edema  -- JVD  --Gallop    Respiratory normal resp effort  clear BS  no use of accessory muscles    Gastrointestinal Normal    Gastrointestinal details normal Soft  Nontender  Nondistended  No masses palpable  Bowel sounds normal   Routine Hem:  23-May-13 06:46    WBC (CBC) 10.4   RBC (CBC) 3.49   Hemoglobin (CBC) 9.3   Hematocrit (CBC) 29.0   Platelet Count (CBC) 323   MCV 83   MCH 26.7   MCHC 32.1   RDW 19.1  Routine Chem:  23-May-13 06:46    Glucose, Serum 107   BUN 46   Creatinine (comp) 1.58   Sodium, Serum 133   Potassium, Serum 3.9   Chloride, Serum 98   CO2, Serum 27   Calcium (Total), Serum 9.8   Anion Gap 8   Osmolality (calc) 279   eGFR (African American) 39   eGFR (Non-African American) 34  Cardiac:  23-May-13 06:46    Troponin I 0.19  Routine Coag:  23-May-13 06:46    Activated PTT (APTT) > 160.0  Routine Hem:  23-May-13 06:46    Neutrophil % 73.0   Lymphocyte % 19.9   Monocyte % 5.7   Eosinophil % 1.1   Basophil % 0.3    Neutrophil # 7.6   Lymphocyte # 2.1   Monocyte # 0.6   Eosinophil # 0.1   Basophil # 0.0  Hepatic:  23-May-13 06:46    Bilirubin, Total 0.3   Alkaline Phosphatase 116   SGPT (ALT) 12   SGOT (AST) 13   Total Protein, Serum 7.7   Albumin, Serum 3.5  Routine Chem:  23-May-13 06:46    Magnesium, Serum 2.5   Radiology Results: XRay:    22-May-13 06:59, Chest PA and Lateral   Chest PA and Lateral    REASON FOR EXAM:    pain  COMMENTS:       PROCEDURE: DXR - DXR CHEST PA (OR AP) AND LATERAL  - Mar 02 2012  6:59AM     RESULT: Comparison is made to the study of November 29, 2011.    The lungs are well-expanded.  There is no focal infiltrate. The cardiac   silhouette is enlarged though stable. The patient has undergone previous   median sternotomy and CABG. I see no pleural effusion or significant   pulmonary vascular congestion. The mediastinum is not abnormally widened.    IMPRESSION:   1. I donot see evidence of pneumonia nor objective evidence of acute CHF.  2. There is mild stable enlargement of the cardiac chambers and evidence     of previous CABG.          Verified By: DAVID A. Martinique, M.D., MD  Cardiology:    22-May-13 05:13, ED ECG   Ventricular Rate 123   Atrial Rate 246   QRS Duration 94   QT 360   QTc 515   P Axis -100   R Axis 2   T Axis 136   ECG interpretation    Atrial flutter with 2:1 A-V conduction  Left ventricular hypertrophy with repolarization abnormality  Inferior infarct (cited on or before 17-Dec-2009)  Abnormal ECG  When compared with ECG of 29-Nov-2011 20:21,  Atrial flutter has replaced Sinus rhythm  ----------unconfirmed----------  Confirmed by OVERREAD, NOT ([id number]), editor PEARSON, BARBARA ([id number]) on 03/02/2012 3:44:35 PM   ED ECG    Assessment/Plan:  Invasive Device Daily Assessment of Necessity:   Does the patient currently have any of the following indwelling devices? none   Assessment/Plan:   Assessment  IMP [place] NQMI? CRI HTN Hyperlipidemia Gout DJD PVD CAD .    Plan PLAN F/U troponin Wean IV NTG Transfer to tele F/U Bun/Cr Continue Statin Advance Imdur to 5m bid Ranexa 500 mg bid Reduce narcotic use/wean We will try medical therapy first   Electronic Signatures: CYolonda Kida(MD)  (Signed 23-May-13 16:13)  Authored: Chief Complaint, VITAL SIGNS/ANCILLARY NOTES, Brief Assessment, Lab Results, Radiology Results, Assessment/Plan   Last Updated: 23-May-13 16:13 by CYolonda Kida(MD)

## 2015-02-03 NOTE — Discharge Summary (Signed)
PATIENT NAME:  Laura Duran, Laura E MR#:  [id number] DATE OF BIRTH:  December 02, 1945  DATE OF ADMISSION:  03/02/2012 DATE OF DISCHARGE:  03/04/2012  DIAGNOSES:  1. Chest pain, possibly due to stable angina from underlying coronary artery disease. 2. Atrial fibrillation/atrial flutter, currently in sinus. 3. Acute on chronic kidney disease.  4. Acute on chronic renal failure. 5. Gout. 6. Hypertension. 7. Hyperlipidemia.  8. Coronary artery disease status post coronary artery bypass graft and stenting.  9. Abdominal aortic aneurysm. 10. Gastroesophageal reflux disease. 11. Chronic obstructive pulmonary disease. 12. Anemia of chronic disease. 13. Rheumatoid arthritis. 14. Gout. 15. Anxiety, panic disorder.  16. Peripheral vascular disease status post left below-knee amputation. 17. Obesity.   DISPOSITION: Patient is being discharged home.   FOLLOW UP: Follow up with primary care physician, Dr. Maryellen PileEason, and cardiologist, Dr. Juliann Paresallwood, in 1 to 2 weeks after discharge.   DIET: Low sodium, 1800 calorie ADA diet.   ACTIVITY: As tolerated.   DISCHARGE MEDICATIONS:  1. Cozaar 50 mg daily.  2. Nitroglycerin 0.4 mg sublingual q.5 minutes x3 p.r.n. chest pain. 3. Imdur 60 mg b.i.d.  4. Ranexa 500 b.i.d.  5. Aspirin 81 mg daily.  6. Pravachol 10 mg daily.  7. Allopurinol 100 mg daily.  8. Coreg 25 mg b.i.d.  9. Xanax 0.5 mg b.i.d. p.r.n.  10. Febuxostat 40 mg daily.  11. Plavix 75 mg daily.  12. Prednisone 5 mg daily.  13. Folic acid 1 mg daily.  14. Lasix 20 mg daily.  15. Hydralazine 25 mg t.i.d.   CONSULTATION: Cardiology consultation with Dr. Juliann Paresallwood.  LABORATORY, DIAGNOSTIC AND RADIOLOGICAL DATA: Chest x-ray showed no acute abnormalities, mild stable cardiomegaly. White count 11.2 to 10.4, hemoglobin 10.2 to 9.3. Normal platelet count. Patient has chronic kidney disease stage III. Creatinine ranging from 1.98 to 1.58. Cardiac enzymes minimally elevated at 0.10 to 0.19.   HOSPITAL  COURSE: Patient is a 69 year old female with past medical history of coronary artery disease, status post coronary artery bypass graft and subsequent stenting, abdominal aortic aneurysm, gastroesophageal reflux disease, chronic obstructive pulmonary disease, chronic kidney disease, hypertension, hyperlipidemia who presented with chest pain. Patient was admitted with diagnosis of unstable angina and initially admitted to the Intensive Care Unit and started on nitroglycerin and heparin drip. A cardiology consultation with Dr. Juliann Paresallwood was obtained who felt that patient did not have unstable angina or acute coronary syndrome. Her troponins were mildly elevated but that could also be due to poor renal clearance resulting from her chronic kidney disease. Patient was weaned off the nitroglycerin drip and started on oral Imdur and p.r.n. nitroglycerin. She remained chest pain free thereafter during the hospitalization. Dr. Juliann Paresallwood felt that patient has stable angina due to her underlying coronary artery disease and he recommended increasing the dose of the Imdur and also starting her on Ranexa. Patient had an episode of atrial fibrillation/atrial flutter initially but has been in normal sinus rhythm throughout the hospitalization. She had acute on chronic renal failure. She has baseline chronic kidney disease, stage III. Her hydrochlorothiazide was held which resulted in some improvement in her creatinine. The rest of her medical problems have remained stable. Patient has been chest pain free after her initial presentation and is being discharged home in stable condition.    TIME SPENT: 45 minutes.   ____________________________ Darrick MeigsSangeeta Zachrey Deutscher, MD sp:cms D: 03/04/2012 14:21:58 ET T: 03/07/2012 10:51:31 ET JOB#: [id number]  cc: Darrick MeigsSangeeta Bakary Bramer, MD, <Dictator> Serita ShellerErnest B. Maryellen PileEason, MD Darrick MeigsSANGEETA Mesiah Manzo MD ELECTRONICALLY SIGNED 03/08/2012  12:18 

## 2015-02-03 NOTE — Consult Note (Signed)
PATIENT NAME:  Laura Duran, Laura Duran MR#:  [id number] DATE OF BIRTH:  1946/03/17  DATE OF CONSULTATION:  11/30/2011  REFERRING PHYSICIAN:  Toy Cookey, MD / Prime Doc  CONSULTING PHYSICIAN:  Dwayne D. Callwood, MD  INDICATION: Non-Q-wave myocardial infarction.   HISTORY OF PRESENT ILLNESS: Ms. Yodice is a 69 year old African American female with a history of coronary artery disease status post myocardial infarction, coronary bypass, PCI and stenting, chronic renal insufficiency, peripheral vascular disease status post below-knee amputation, hypertension, hyperlipidemia, diabetes, who reportedly came in with chest heaviness, pressure, and tightness across the chest after finishing dinner. She also states the pain radiated to her jaw where she had throbbing pain in a wisdom tooth, similar to the symptoms she had when she had a myocardial infarction. The pain has been intermittent. There was no blackout spells or syncope. Again, the pain was midsternal and relatively severe so she came to emergency room and then was advised to be admitted.   REVIEW OF SYSTEMS: No blackout spells or syncope. No nausea or vomiting. No fever, no chills, and no sweats. No weight loss and no weight gain. No hemoptysis or hematemesis. No bright red blood per rectum. She complained of angina and radiation of pain to her jaw and arms.   PAST MEDICAL HISTORY:  1. Coronary artery disease. 2. Abdominal aortic aneurysm.  3. Peripheral vascular disease. 4. Diabetes. 5. Hypertension. 6. Reflux. 7. Chronic obstructive pulmonary disease. 8. Chronic renal insufficiency. 9. Decubitus ulcer.  10. Anemia.  11. Hyperlipidemia.  12. Hypertension. 13. Rheumatoid arthritis. 14. Gouty arthritis.  15. Anxiety/panic disorder. 16. Obesity. 17. Fracture of the right arm.   PAST SURGICAL HISTORY:  1. Hysterectomy.  2. Bypass surgery. 3. Below-knee amputation. 4. Angioplasty and stenting of coronary arteries.   ALLERGIES: No known  drug allergies.  MEDICATIONS:  1. Medrol Dosepak. 2. Aspirin 81 mg a day.  3. Losartan/HCTZ 50/12.5 mg twice a day.  4. Pravastatin 10 mg a day.  5. Alprazolam 0.5 mg twice a day as needed.  6. Atenolol 100 mg twice a day.   FAMILY HISTORY: Hypertension, heart disease, diabetes, and hyperlipidemia.   SOCIAL HISTORY: Married, lives with her husband, retired, quit smoking 15 years ago.  PHYSICAL EXAMINATION:  VITALS: Blood pressure 117/70, pulse 120 and regular, respiratory rate 20, afebrile.   HEENT: Normocephalic, atraumatic. Pupils are equal and reactive to light.   NECK: Supple. No significant jugular venous distention, bruits, or adenopathy.   LUNGS: Clear to auscultation and percussion. No significant wheeze, rhonchi, or rales.   HEART: Irregularly, irregular. Systolic ejection murmur at the apex. PMI slightly displaced laterally. Soft S3.   ABDOMEN: Examination is benign. Positive bowel sounds. No rebound, guarding, or tenderness.   EXTREMITIES: Left below-knee amputation, otherwise unremarkable.   NEUROLOGIC: Examination is intact.   SKIN: Examination is normal.   LABS/STUDIES: Troponin 0.04 which elevated to 5 on subsequent studies. PT-INR normal. White count 16, hemoglobin 11.6, hematocrit 35.8, platelet count 590. Glucose 205, BUN 51, creatinine 1.93, sodium 126, potassium 4.1. LFTs essentially negative.   EKG: Atrial fibrillation, rate of 130, irregular.  Chest x-ray is unremarkable.   ASSESSMENT:  1. Non-Q-wave myocardial infarction.  2. Rapid atrial fibrillation. 3. Unstable angina.  4. Angina.  5. Hypertension.  6. Renal insufficiency. 7. Diabetes. 8. Peripheral vascular disease. 9. Rheumatoid arthritis. 10. Obesity. 11. Gastroesophageal reflux disease. 12. Anxiety.  PLAN: Agree with admit. Continue further cardiac work-up for acute myocardial infarction. Followup EKGs and followup enzymes. Continue telemetry. Continue  pain control with morphine as  necessary. Continue anticoagulation. Consider echocardiogram. Continue blood pressure control. Continue diabetes management and control. Lipid therapy should be continued. Consider echocardiogram for assessment of LV function and direct to cardiac catheterization       because of the nature of the non-Q-wave myocardial infarction and history of coronary artery bypass surgery to evaluate coronary bypass grafts. Concern one of them may have become occluded with this episode. ____________________________ Bobbie Stackwayne D. Juliann Paresallwood, MD ddc:slb D: 11/30/2011 21:42:20 ET T: 12/01/2011 09:11:33 ET JOB#: [id number]  cc: Dwayne D. Juliann Paresallwood, MD, <Dictator> Alwyn PeaWAYNE D CALLWOOD MD ELECTRONICALLY SIGNED 12/29/2011 10:04

## 2015-02-03 NOTE — Consult Note (Signed)
Chief Complaint:   Subjective/Chief Complaint Pt still having recurrent anginal symtoms. CP 8/10.  no n/v.   VITAL SIGNS/ANCILLARY NOTES: **Vital Signs.:   19-Feb-13 14:12   Vital Signs Type Routine   Temperature Temperature (F) 97.7   Celsius 36.5   Temperature Source oral   Pulse Pulse 56   Pulse source per Dinamap   Respirations Respirations 18   Systolic BP Systolic BP 93   Diastolic BP (mmHg) Diastolic BP (mmHg) 56   Mean BP 68   BP Source Dinamap   Pulse Ox % Pulse Ox % 98   Pulse Ox Activity Level  At rest   Oxygen Delivery Room Air/ 21 %  *Intake and Output.:   Shift 19-Feb-13 15:00   Grand Totals Intake:  420 Output:  200    Net:  220 24 Hr.:  220   Oral Intake      In:  120   IV (Primary)      In:  300   Urine ml     Out:  200   Length of Stay Totals Intake:  660 Output:  2325    Net:  -1665   Brief Assessment:   Cardiac Regular  murmur present  -- carotid bruits  -- JVD  --Gallop    Respiratory normal resp effort  clear BS    Gastrointestinal Normal    Gastrointestinal details normal Soft  Nontender  Nondistended  No masses palpable  Bowel sounds normal  No rebound tenderness   Cardiac:  19-Feb-13 10:21    Troponin I 7.07  Routine Chem:  19-Feb-13 10:21    Glucose, Serum 127   BUN 40   Creatinine (comp) 1.45   Sodium, Serum 136   Potassium, Serum 3.9   Chloride, Serum 103   CO2, Serum 22   Calcium (Total), Serum 9.3   Osmolality (calc) 283   eGFR (African American) 47   eGFR (Non-African American) 38   Anion Gap 11   Radiology Results: XRay:    17-Feb-13 21:08, Chest Portable Single View   Chest Portable Single View    REASON FOR EXAM:    Chest Pain  COMMENTS:       PROCEDURE: DXR - DXR PORTABLE CHEST SINGLE VIEW  - Nov 29 2011  9:08PM     RESULT: Comparison: 08/09/2010    Findings:  Heart size upper limits of normal, similar to prior. Prior median   sternotomy andCABG. The superior wires are fractured, with displaced    fragments, similar to prior. No focal pulmonary opacities. There are   moderate degenerative changes of the right shoulder.    IMPRESSION:   No acute cardiopulmonary disease.    Verified By: Gregor Hams, M.D., MD  Cardiology:    17-Feb-13 20:21, ECG   Ventricular Rate 144   Atrial Rate 144   P-R Interval 200   QRS Duration 94   QT 272   QTc 421   R Axis 4   T Axis -47   ECG interpretation    Sinus tachycardia  Left ventricular hypertrophy with repolarization abnormality  Inferior infarct (cited on or before 17-Dec-2009)  Abnormal ECG  When compared with ECG of 28-Jun-2010 14:08,  Vent. rate has increased BY  50 BPM  ST no longer elevatedin Inferior leads  Inverted T waves have replaced nonspecific T wave abnormality in Inferior leads  Nonspecific T wave abnormality, worse in Lateral leads  ----------unconfirmed----------  Confirmed by OVERREAD, NOT ([id number]), editor PEARSON, BARBARA ([id number]) on  11/30/2011 12:13:38 PM   ECG    Assessment/Plan:  Invasive Device Daily Assessment of Necessity:   Does the patient currently have any of the following indwelling devices? none   Assessment/Plan:   Assessment IMP NQMI [place] Angina AFIB->NSR Bradycardia CAD HTN Murmur Hyperlipidemia DM PVD S/P cath multvessel CAD .    Plan PLAN Tele Continue ASA Reduce Atenolo 50 bid Recuce amniodarone 200 daily Continue Imdur for angina Consider Ranexa for angina Lovenox for post MI cp Consider evaluation at [facility] if symtoms continue Consider IABP to help with possible on going ischemia   Electronic Signatures: Lujean Amel D (MD)  (Signed 19-Feb-13 15:19)  Authored: Chief Complaint, VITAL SIGNS/ANCILLARY NOTES, Brief Assessment, Lab Results, Radiology Results, Assessment/Plan   Last Updated: 19-Feb-13 15:19 by Yolonda Kida (MD)

## 2015-02-10 NOTE — H&P (Signed)
PATIENT NAME:  Laura Duran, DIAL MR#:  [id number] DATE OF BIRTH:  06-14-1946  DATE OF ADMISSION:  11/01/2014  ADMITTING PHYSICIAN: Enid Baas, MD   PRIMARY CARE PHYSICIAN: Dr. Duncan Dull    PRIMARY CARDIOLOGIST: Dr. Juliann Pares.    CHIEF COMPLAINT: Weakness, sent in from PCP's office for anemia.   HISTORY OF PRESENT ILLNESS: Laura Duran is a 69 year old female with multiple past medical problems including coronary artery disease status post bypass graft surgery, last admission in October of 2015 and she had an LAD stent with a drug eluding stent put in  the hospital on aspirin and Eliquis at this time, not sure why she stopped taking Plavix, history of arthritis, hypertension, gout, peripheral vascular disease, chronic kidney disease, congestive heart failure, who presents to the hospital secondary to ongoing weakness for 3 days and anemia on blood work done by PCP 2 days ago. The patient went to see Dr. Melina Schools nurse practitioner a couple of days ago because she was feeling extremely fatigued and had a dark, tarry stool a couple of times. Initially, she thought it secondary to something she ate, however, her weakness was worsening, so she went to see them and they had blood work ordered and called in today, saying that she needed to go to the ER because her hemoglobin was low at 7.9. Her baseline hemoglobin seems to be around 10. In light of her recent cardiac history with stent put in 2 months ago, she is being admitted for transfusion and further gastrointestinal work-up. Stool for Hemoccult is positive.   PAST MEDICAL HISTORY:  1. Arthritis.  2. Hypertension.  3. Hyperlipidemia.  4. Gout.  5. Coronary artery disease status post bypass graft surgery and LAD stent about 3 months ago in October of 2015.  6. Angina at rest.  7. Peripheral vascular disease.  8. CKD. 9. Congestive heart failure.   PAST SURGICAL HISTORY:  1. Coronary artery bypass graft surgery.  2. Left leg below knee  amputation secondary to peripheral vascular disease and ischemia.  3. Recent cardiac stent.   ALLERGIES TO MEDICATIONS: DILAUDID.   CURRENT HOME MEDICATIONS:  1. Xanax 0.5 mg p.o. b.i.d. p.r.n. for anxiety.  2. Amiodarone 200 mg p.o. daily.  3. Aspirin 81 mg p.o. daily.  4. Coreg 25 mg p.o. b.i.d.  5. Plavix 75 mg p.o. daily.  6. Eliquis 5 mg p.o. b.i.d.  7. Folic acid 1 mg p.o. daily.  8. Lasix 20 mg p.o. daily.  9. Hydralazine 25 mg 3 times a day.  10. Imdur 60 mg p.o. b.i.d.  11. Losartan 50 mg p.o. daily.  12. Pravastatin 20 mg p.o. daily.  13. Ranexa 500 mg p.o. b.i.d.  14. Tramadol 50 mg b.i.d. p.r.n. for pain.   SOCIAL HISTORY: Lives at home with her husband. Independent at baseline, previous history of smoking, none currently; no alcohol use.   FAMILY HISTORY: Significant for heart problems.   REVIEW OF SYSTEMS:  CONSTITUTIONAL: No fever. Positive for fatigue and weakness.  EYES: No blurred vision, double vision, inflammation or glaucoma.  ENT: No tinnitus, ear pain, hearing loss, epistaxis or discharge.  RESPIRATORY: No cough, wheeze, hemoptysis or chronic obstructive pulmonary disease.  CARDIOVASCULAR: No chest pain, orthopnea, edema, arrhythmia, palpitations, or syncope.  GASTROINTESTINAL: Positive for nausea. No vomiting, diarrhea. Positive for mild abdominal pain and also melena.  GENITOURINARY: No dysuria, hematuria, renal calculus, increased frequency.  ENDOCRINE: No polyuria, nocturia, thyroid problems or heat or cold intolerance.  HEMATOLOGY: Positive for anemia, easy  bruising or bleeding care.  SKIN: No acne, rash or lesions.  MUSCULOSKELETAL: No neck, back, shoulder pain, arthritis or gout.  NEUROLOGICAL: No numbness, weakness, CVA, transient ischemic attack or seizures.  PSYCHOLOGICAL: No anxiety, insomnia, depression,   PHYSICAL EXAMINATION:  VITAL SIGNS: Temperature 98.1 degrees Fahrenheit, pulse 65, respirations 18, blood pressure 134/63, pulse ox 100%  on room air.  GENERAL: Well-built, well-nourished female lying in bed, not in any acute distress.  HEENT: Normocephalic, atraumatic. Pupils equal, round, and reacting to light. Anicteric sclerae. Extraocular movements intact. Oropharynx clear without erythema, mass or exudates.  NECK: Supple. No thyromegaly, JVD or carotid bruits. No lymphadenopathy.  LUNGS: Moving air bilaterally. No wheeze or crackles. No use of accessory muscles for breathing.  CARDIOVASCULAR: S1,  S2 regular rate and rhythm with 3/6 systolic murmur present. No  rubs, or gallops.  ABDOMEN: Obese, soft, nontender, nondistended. No hepatosplenomegaly. Normal bowel sounds.  EXTREMITIES: Right lower extremity normal with palpable dorsalis pedis pulse, left lower extremity status post amputation and prosthetic limb in place. No clubbing or cyanosis.  LYMPHATICS: No cervical lymphadenopathy.  NEUROLOGIC: Cranial nerves intact. No focal motor or sensory deficits.  PSYCHOLOGICAL: The patient is awake, alert, oriented x3.   LABORATORY DATA: WBC 6.6, hemoglobin 7.3, hematocrit 22.7, platelet count 319,000.   Sodium 138, potassium 4.3, chloride 106, bicarbonate 23, BUN 38, creatinine 1.93, glucose 66, and calcium of 9.1.   ALT 19, AST 18, alkaline phosphatase 94, total bilirubin 0.2, and albumin of 3.2. INR is 1.9, PTT is 41.1. Troponin is slightly elevated at 0.07.   ASSESSMENT AND PLAN: This is a 69 year old female with multiple medical problems, including coronary artery disease status post bypass graft surgery, recent cardiac stent put in, CKD, peripheral vascular disease, hypertension, hypertension, gout, paroxysmal atrial fibrillation on Eliquis, who presented to the hospital with GI bleed and anemia.   1. Acute on chronic anemia, secondary to gastrointestinal bleed. The patient is taking aspirin and also Eliquis at home. She was supposed to be on Plavix after her stent, but for some reason, she has not been taking the medicine.  With her gastrointestinal bleed, we will hold off for Eliquis, monitor hemoglobin and hematocrit, will need to continue the aspirin and Plavix. Will get cardiology consult for the same. Likely source gastrointestinal because of the melena. Will have gastrointestinal consult as well, 2 units of packed RBCs have been ordered. Continue to monitor at this time. Vitals are stable otherwise.  2. Acute renal failure on chronic kidney disease. Baseline creatinine seems to be around 1.4. Now, it is elevated at 1.9, likely acute tubular necrosis from anemia.  Will followup after transfusion. IV fluids and monitor at this time. Avoid nephrotoxins.  3. Hypertension. Continue home medications at this time.  4. Coronary artery disease status post coronary artery bypass graft, and recent stent in October of 2015. Continue aspirin, start her on Plavix at this time. Continue cardiac medications. No active chest pain. Troponins slightly elevated, may be demand ischemia from her anemia. Monitor on telemetry, cardiology consult, and recycle troponins at this time. No systemic anticoagulation, especially with the anemia.  5. Paroxysmal atrial fibrillation now with gastrointestinal bleed and anemia. Hold Eliquis.  Rate controlled. She is on amiodarone and also Coreg at this time.  6. Deep vein thrombosis prophylaxis. Avoid subcutaneous Heparin or Lovenox because of her  anemia. Do thrombo-embolic deterrent and sequential compression devices at this time.   CODE STATUS: Full code.   TIME SPENT ON ADMISSION: 50 minutes.  ____________________________ Enid Baasadhika Osama Coleson, MD rk:ap D: 11/01/2014 18:57:25 ET T: 11/01/2014 19:20:36 ET JOB#: [id number]  cc: Enid Baasadhika Elinore Shults, MD, <Dictator> Enid BaasADHIKA Schylar Wuebker MD ELECTRONICALLY SIGNED 11/04/2014 14:39

## 2015-02-10 NOTE — Consult Note (Signed)
PATIENT NAME:  Laura JourdainHOOPER, Laura E MR#:  [id number] DATE OF BIRTH:  Feb 06, 1946  DATE OF CONSULTATION:  11/02/2014  REFERRING PHYSICIAN:   CONSULTING PHYSICIAN:  Christena DeemMartin U. Tajah Noguchi, MD  REASON FOR CONSULTATION: GI bleed, melena/anemia.   HISTORY OF PRESENT ILLNESS: [name] is a 69 year old African-American female who was in her usual state of health, went to her primary physician's office 2 days ago with a complaint of feeling weak and tired. She had some laboratories done, these were found to have a low hemoglobin and she was recommended to come to the hospital today. She states that she has had some nausea for about 2 days. There has been no vomiting. There has been no hematemesis. There has been no heartburn or dysphagia. Her appetite has been good. She states she generally has a bowel movement about every other day, occasionally has to use a suppository to help her with this. She states that she had 1 black bowel movement about 2 weeks ago and then a dark bowel movement yesterday. She stated that yesterday's bowel movement was not black, but rather just dark. She has seen no maroon stools or rectal bleeding. She has no previous history of peptic ulcer disease. She has never had a previous EGD or colonoscopy. She does have a history of having coronary artery disease with coronary artery bypass grafting over 15 years ago. However she had a drug-eluting stent placed on 07/26/2014 and at that time was placed on Eliquis. She evidently has been on Plavix in the past. She has been seen by cardiology, Eliquis  has been stopped and a combination of 81 mg aspirin and Plavix has been restarted. She denies any abdominal pain.   PAST MEDICAL HISTORY:  1.  Arthritis.  2.  Hypertension.  3.  Hyperlipidemia.  4.  Gout. I quizzed quite a bit about this in regards to the possibility of taking over-the-counter nonsteroidal anti-inflammatory drugs.   She does not currently take any gout medications, neither colchicine  or allopurinol. She again denies the use of any aspirin or nonsteroidal anti-inflammatory drug  products with the exception of her 81 mg aspirin.  5.  Coronary artery disease as noted above.  6.  Angina at rest.  7.  Peripheral vascular disease.  8.  Chronic kidney disease.  9.  Congestive heart failure.  10.  Left BKA secondary to peripheral vascular disease and ischemia.   ALLERGIES: SHE IS ALLERGIC TO DILAUDID.     OUTPATIENT MEDICATIONS: Include alprazolam 0.5 mg every 12 hours, amiodarone 200 mg 1 tablet once a day, carvedilol 25 mg twice a day, clopidogrel 75 mg once a day that she currently is not taking, rather she is taking Eliquis 5 mg twice a day, folate acid 1 mg once a day, Lasix 20 mg once a day as needed, hydralazine 25 mg 1 tablet 3 times a day, isosorbide mononitrate 60 mg twice a day, losartan 50 mg once a day, pravastatin 20 mg once a day. Ranexa 500 mg twice a day, tramadol 50 mg once a day.   REVIEW OF SYSTEMS:  Per admission history and physical, agree with same.   PHYSICAL EXAMINATION:  VITAL SIGNS: Temperature is 97.5, pulse 59, respirations 18, blood pressure 156/62, pulse oximetry 97%.  GENERAL: She is a 69 year old African-American female in no acute distress.  HEENT: Normocephalic, atraumatic.  EYES: Anicteric.  NOSE: Septum midline. No lesions.  OROPHARYNX: No lesions.  NECK: Supple. No JVD. No lymphadenopathy. No thyromegaly.  HEART: Regular rate and  rhythm.  LUNGS: Clear.  ABDOMEN: Soft, nontender, nondistended. Bowel sounds positive, normoactive.  RECTAL: Anorectal exam deferred. She was Hemoccult positive in the ER.  EXTREMITIES: No clubbing, cyanosis, or edema.  NEUROLOGICAL: Cranial nerves II through XII grossly intact. Muscle strength bilaterally equal and symmetric. DTRs bilaterally equal and symmetric.   Of note the patient has had no bowel movement since admission to the hospital.   LABORATORY DATA:  Today's laboratories include a BUN of 35,  creatinine 1.79, sodium 141, calcium 8.8. Hepatic profile normal with a slightly low albumin at 3.2 yesterday. Troponin I x 3 0.07, 0.08, 0.08 respectively. Her hemoglobin on admission was 7.2, hematocrit 22.7. She received 2 units of packed red cells with appropriate increase of her hemoglobin to 9.8. A recheck a couple of hours ago showed a 10.1. Her platelet count this morning was 284,000. Her MCV was 88 on admission to the hospital. She had an INR of 1.9 on admission, activated PTT 41.1. There have been no imaging studies.   ASSESSMENT:  Melena/anemia. The patient has been hemodynamically stable. She is now status post 2 units packed red blood cells with an appropriate rise of hemoglobin. She has had no recurrent bowel movement since coming to the hospital. She was not taking any type of gastric acid prophylaxis such as PPI or H2RA at home despite being on aspirin and anticoagulation measures. It is of note that she also has problems with gout and is not currently being treated for that.   RECOMMENDATION:  1.  Continue IV as you are. I have discussed this with Dr. Elisabeth Pigeon earlier today.  2.  Serial hemoglobins, transfuse as needed.  3.  Close observation.  4.  The patient will need a luminal evaluation but would hold this unless there are acute changes. She has never had a colonoscopy in the past. She has not had any upper GI symptoms with the exception of some nausea over the past 2 days. Her appetite has been good.   We will follow with you.    ____________________________ Christena Deem, MD mus:bu D: 11/02/2014 16:51:50 ET T: 11/02/2014 18:09:41 ET JOB#: [id number]  cc: Christena Deem, MD, <Dictator> Christena Deem MD ELECTRONICALLY SIGNED 11/05/2014 13:41

## 2015-02-10 NOTE — Consult Note (Signed)
Chief Complaint:  Subjective/Chief Complaint seen for anemia and heme positive stool possible melena-not recurrent since admission, hgb stable. No nausea or abdominal pain.  No bm since admission.   VITAL SIGNS/ANCILLARY NOTES: **Vital Signs.:   23-Jan-16 08:16  Temperature Temperature (F) 98.2  Celsius 36.7  Temperature Source oral  Pulse Pulse 70  Respirations Respirations 20  Systolic BP Systolic BP 174  Diastolic BP (mmHg) Diastolic BP (mmHg) 74  Mean BP 107  Pulse Ox % Pulse Ox % 97  Pulse Ox Activity Level  At rest  Oxygen Delivery Room Air/ 21 %   Brief Assessment:  Cardiac Regular   Respiratory clear BS   Gastrointestinal details normal Soft  Nontender  Nondistended  Bowel sounds normal   Lab Results: Routine Chem:  21-Jan-16 16:10   BUN  38  Creatinine (comp)  1.93  22-Jan-16 00:00   BUN -  Creatinine (comp) -    04:28   BUN  35  Creatinine (comp)  1.79  Routine Hem:  21-Jan-16 16:10   Hemoglobin (CBC)  7.2  22-Jan-16 00:00   Hemoglobin (CBC) -    04:28   Hemoglobin (CBC)  9.8    13:49   Hemoglobin (CBC)  10.1 (Result(s) reported on 02 Nov 2014 at 02:07PM.)  23-Jan-16 04:57   WBC (CBC) 5.8  RBC (CBC)  3.46  Hemoglobin (CBC)  9.8  Hematocrit (CBC)  30.8  Platelet Count (CBC) 294  MCV 89  MCH 28.2  MCHC  31.7  RDW  16.5  Neutrophil % 63.8  Lymphocyte % 20.1  Monocyte % 9.5  Eosinophil % 5.8  Basophil % 0.8  Neutrophil # 3.7  Lymphocyte # 1.2  Monocyte # 0.6  Eosinophil # 0.3  Basophil # 0.0 (Result(s) reported on 03 Nov 2014 at 05:33AM.)   Assessment/Plan:  Assessment/Plan:  Assessment 1) anemia, posible melena as o/p, heme positive.  stable, no recurrent melena since admission.   2) patietn on plavix and ASA for recent cardiac stent.   3) multiple medical issues with CAD, CKD, HTN, gout, PVD with h/o bka, chf,   Plan 1) continue current.  No evidence of active GI bleeding.  Patient should be on a daily ppi as outpatient, but would  continue bid for 6-8 weeks.  I will chack h pylori abs, will need GI fu and egd/colonoscopy when clinically feasible. Discussed with DR Elisabeth PigeonVachhani.  Agree with fld.   Electronic Signatures: Barnetta ChapelSkulskie, Leelyn Jasinski (MD)  (Signed 23-Jan-16 12:53)  Authored: Chief Complaint, VITAL SIGNS/ANCILLARY NOTES, Brief Assessment, Lab Results, Assessment/Plan   Last Updated: 23-Jan-16 12:53 by Barnetta ChapelSkulskie, Jabron Weese (MD)

## 2015-02-10 NOTE — Discharge Summary (Signed)
PATIENT NAME:  Laura Duran, Laura Duran MR#:  [id number] DATE OF BIRTH:  [birth year]  DATE OF ADMISSION:  11/01/2014 DATE OF DISCHARGE:  11/04/2014   DISCHARGE DIAGNOSES:  1.  Anemia due to gastrointestinal blood loss. 2.  Coronary artery disease.  3.  Hypertension.  4.  Acute renal failure.   CONDITION ON DISCHARGE: Stable.   MEDICATIONS:   1.  Carvedilol 25 mg oral 2 times a day.  2.  Hydralazine 25 mg 3 times a day.  3.  Lovastatin 20 mg oral once a day.  4.  Aspirin 81 mg once a day.  5.  Losartan 50 mg once a day.  6.  Folic acid 1 mg once a day.  7.  Amiodarone 200 mg once a day.  8.  Isosorbide mononitrate 60 mg oral 2 times a day.  9.  Alprazolam 0.5 mg oral every 12 hours.  11. Clopidigrel 75 mg once a day.  12. Polyethylene glycol 17 grams oral once a day as needed for constipation.  13. Protonix 40 mg oral 2 times a day.   14. Advised to stop taking Eliquis.    DIET ON DISCHARGE: Low-sodium, low-fat, low-cholesterol, regular consistency diet as tolerated and advised to follow in 1 to 2 weeks in GI and cardiology clinic.      HISTORY OF PRESENT ILLNESS: A 69 year old female with multiple past medical history including coronary artery disease, status post bypass graft surgery, last admission was in October 2015. She had an LAD stent with drug-eluting stent in the hospital, on aspirin and Eliquis. She stopped taking Plavix, presented to hospital secondary to an ongoing weakness for 3 days and blood work showed anemia.  Her PMD's office did some blood work and showed hemoglobin of 7.9. Baseline hemoglobin was around 10 so they called her and told her to go to the Emergency Room.  She had a recent cardiac history and stent put in 2 months ago.   HOSPITAL COURSE AND STAY:  1.  Acute on chronic anemia secondary to gastrointestinal bleed. She was taking aspirin and Eliquis, had recent drug-eluting stent, received blood transfusion of 2 units to bring up her hemoglobin level, as target level  was up to 10.  We held Eliquis and started on Plavix and continued aspirin and Plavix as per cardiologist and gastrointestinal both agreed. She remained stable in the hospital after transfusion.  Hemoglobin was stable and she was tolerating diet without any problems, so GI suggested not to do any work-up as she will need to continue her antiplatelet agents and continue PPI b.i.d. for 6 to 8 weeks, and so we discharged her home.  2.  Acute renal failure on chronic kidney disease.  Baseline creatinine was around 1.4.  She came with 1.9, mostly acute tubular necrosis due to anemia.  She was encouraged to have more p.o. intake and her creatinine remains stable in the hospital.  3.  Hypertension. We will continue home medications in the hospital.  4.  Coronary artery disease, status post coronary artery bypass graft and stent in October 2015.  5.  Paroxysmal atrial fibrillation. Cardiology consult was called in and they agreed with stopping the Eliquis and continuing aspirin and Plavix and carvedilol for rate control and amiodarone at this time.  We discharged her home with this and advised her to follow in cardiology and gastrointestinal clinic.   CONSULTS IN THE HOSPITAL:  Gastrointestinal with Dr. Marva Panda and cardiology with Paraschos.    IMPORTANT LABORATORY RESULTS IN THE HOSPITAL:  WBC count 6.6, hemoglobin was 7.2, and platelet count 319,000. MCV was 88.  Glucose 66, creatinine 1.93, sodium 138, potassium was 4.3, chloride 106 and CO2 was 23. Troponin 0.08. After transfusion hemoglobin came up to 9.8, creatinine improved to 1.79 and hemoglobin improved to 10.1.  Her urinalysis was negative.   TOTAL TIME SPENT ON THIS DISCHARGE:  40 minutes.      ____________________________ Hope PigeonVaibhavkumar G. Elisabeth PigeonVachhani, MD vgv:DT D: 11/08/2014 15:08:44 ET T: 11/08/2014 19:40:49 ET JOB#: [id number]  cc: Hope PigeonVaibhavkumar G. Elisabeth PigeonVachhani, MD, <Dictator> Christena DeemMartin U. Skulskie, MD Marcina MillardAlexander Paraschos, MD Dwayne D. Juliann Paresallwood,  MD Serita ShellerErnest B. Maryellen PileEason, MD  Altamese DillingVAIBHAVKUMAR Jareli Highland MD ELECTRONICALLY SIGNED 11/26/2014 9:52

## 2015-02-10 NOTE — Consult Note (Signed)
PATIENT NAME:  Laura Duran, Laura Duran MR#:  [id number] DATE OF BIRTH:  1946-03-24  DATE OF CONSULTATION:  11/02/2014  REFERRING PHYSICIAN:   CONSULTING PHYSICIAN:  Marcina MillardAlexander Tod Abrahamsen, MD  PRIMARY CARE PHYSICIAN: Duncan Dulleresa Tullo, MD   CARDIOLOGIST: Bobbie Stackwayne D. Callwood, MD    CHIEF COMPLAINT: Weakness.   REASON FOR CONSULTATION: Consultation requested for evaluation of antiplatelet and anticoagulation in patient who presents with anemia and probable GI bleed.   HISTORY OF PRESENT ILLNESS: The patient is a 69 year old female with known history of CAD, status post CABG with recent drug-eluting stent of LAD 07/26/2014. The patient has also had a recent diagnosis of atrial fibrillation currently treated with amiodarone for rhythm control and Eliquis for stroke prevention. The patient was seen by her primary care provider for a 3 day history of weakness and was noted to be anemic. The patient has had a dark tarry stool. In the Emergency Room, the patient was noted to be markedly anemic with a hemoglobin of 7.9. The patient denies chest pain, troponin is negative. Eliquis has been withheld.   PAST MEDICAL HISTORY: 1.  Status post CABG.  2.  Status post DES of LAD 07/2014.  3.  Paroxysmal atrial fibrillation.  4.  Hypertension.  5.  Hyperlipidemia.  6.  Gout.  7.  Peripheral vascular disease.  8.  Chronic kidney disease.  9.  Congestive heart failure arthritis.  10.   Arthritis.   MEDICATIONS: Aspirin 81 mg daily, Plavix 75 mg daily (not been taking), Eliquis 5 mg b.i.d., amiodarone 200 mg daily, Lasix 20 mg daily, hydralazine 25 mg t.i.d., Imdur 60 mg b.i.d., losartan 50 mg daily, pravastatin 20 mg daily, Ranexa 500 mg b.i.d., Xanax 0.5 mg b.i.d. p.r.n., folic acid 1 mg daily, tramadol 50 mg b.i.d. p.r.n.   SOCIAL HISTORY: The patient is married, lives with her husband. She denies tobacco abuse.   FAMILY HISTORY: Father is status post MI .   REVIEW OF SYSTEMS:  CONSTITUTIONAL: No fever or chills.   EYES: No blurry vision.  EARS: No hearing loss.  RESPIRATORY: Mild shortness of breath.  CARDIOVASCULAR: Mild chest tightness. No chest pain.  GASTROINTESTINAL: Dark tarry stool as described above.  GENITOURINARY: No dysuria or hematuria.  ENDOCRINE: No polyuria or polydipsia.  MUSCULOSKELETAL: No arthralgias or myalgias.  INTEGUMENTARY: No rash.  NEUROLOGICAL: No focal muscle weakness or numbness.  PSYCHOLOGICAL: No depression or anxiety.   PHYSICAL EXAMINATION: VITAL SIGNS: Blood pressure 161/75, pulse 62, respirations 18, temperature 97.4, pulse oximetry 99%.  HEENT: Pupils equal and reactive to light and accommodation.  NECK: Supple without thyromegaly.  LUNGS: Clear.  CARDIOVASCULAR: Normal JVP. Normal PMI. Regular rate and rhythm. Normal S1, S2. No appreciable gallop, murmur, or rub.  ABDOMEN: Soft and nontender. Pulses were intact bilaterally.  MUSCULOSKELETAL: Normal muscle tone.  NEUROLOGIC: The patient is alert and oriented x 3. Motor and sensory both grossly intact.   IMPRESSION: A 69 year old female with known coronary artery disease, status post recent drug-eluting stent in 10/15, is prescribed triple therapy with aspirin, clopidogrel, and Eliquis but has been taking only aspirin and Eliquis, presents with marked anemia and Hemoccult positive stools with history of dark tarry stools likely due to GI bleed.   RECOMMENDATIONS: 1.  Hold Eliquis for now.  2.  Continue aspirin and clopidogrel as long as patient remains clinically and hemodynamically stable.  3.  Await GI consult.  4.  Closely follow hemoglobin and hematocrit, try to maintain hemoglobin and hematocrit at 10 and 30, respectively.  ____________________________ Marcina Millard, MD ap:at D: 11/02/2014 08:54:03 ET T: 11/02/2014 09:29:45 ET JOB#: [id number]  cc: Marcina Millard, MD, <Dictator> Marcina Millard MD ELECTRONICALLY SIGNED 12/04/2014 14:35

## 2015-02-10 NOTE — Consult Note (Signed)
Brief Consult Note: Diagnosis: anemia, heme positive stool.   Patient was seen by consultant.   Consult note dictated.   Recommend further assessment or treatment.   Discussed with Attending MD.   Comments: Please see Gi consult [phone].  Patietn sent from PMD office with anemia, some recent black stools.  No Current active GI bleeding.  Patient on anticoagulation for recent drug eluting stent in setting of CAD.  Hemodynamically stable.  Would recommend placing on iv ppi bid for now, she will need to be on ppi as o/p.  Would hold luminal evaluation for now as she has been hemodynamically stable, no current bleeding.  She is higher risk for complications if scoped while anticoagulated, would hold on this unless there is a clinical change.  Serial hgb, transfuse if needed.  Following.  Electronic Signatures: Barnetta ChapelSkulskie, Martin (MD)  (Signed 22-Jan-16 16:58)  Authored: Brief Consult Note   Last Updated: 22-Jan-16 16:58 by Barnetta ChapelSkulskie, Martin (MD)

## 2015-02-11 ENCOUNTER — Ambulatory Visit: Payer: Medicare Other

## 2015-02-13 ENCOUNTER — Ambulatory Visit: Payer: Medicare Other

## 2015-02-14 ENCOUNTER — Emergency Department
Admission: EM | Admit: 2015-02-14 | Discharge: 2015-02-14 | Disposition: A | Discharge status: Home / Self Care | Payer: Medicare Other | Attending: Emergency Medicine | Admitting: Emergency Medicine

## 2015-02-14 ENCOUNTER — Encounter: Payer: Self-pay | Admitting: Occupational Medicine

## 2015-02-14 ENCOUNTER — Ambulatory Visit: Payer: Medicare Other

## 2015-02-14 DIAGNOSIS — Z87891 Personal history of nicotine dependence: Secondary | ICD-10-CM | POA: Diagnosis not present

## 2015-02-14 DIAGNOSIS — I129 Hypertensive chronic kidney disease with stage 1 through stage 4 chronic kidney disease, or unspecified chronic kidney disease: Secondary | ICD-10-CM | POA: Diagnosis not present

## 2015-02-14 DIAGNOSIS — N189 Chronic kidney disease, unspecified: Secondary | ICD-10-CM | POA: Insufficient documentation

## 2015-02-14 DIAGNOSIS — Z79899 Other long term (current) drug therapy: Secondary | ICD-10-CM | POA: Diagnosis not present

## 2015-02-14 DIAGNOSIS — I10 Essential (primary) hypertension: Secondary | ICD-10-CM

## 2015-02-14 DIAGNOSIS — Z7982 Long term (current) use of aspirin: Secondary | ICD-10-CM | POA: Diagnosis not present

## 2015-02-14 LAB — URINALYSIS COMPLETE WITH MICROSCOPIC (ARMC ONLY)
Bacteria, UA: NONE SEEN
Bilirubin Urine: NEGATIVE
Glucose, UA: NEGATIVE mg/dL
Hgb urine dipstick: NEGATIVE
Ketones, ur: NEGATIVE mg/dL
Leukocytes, UA: NEGATIVE
Nitrite: NEGATIVE
PH: 6 (ref 5.0–8.0)
PROTEIN: 100 mg/dL — AB
Specific Gravity, Urine: 1.004 — ABNORMAL LOW (ref 1.005–1.030)

## 2015-02-14 LAB — BASIC METABOLIC PANEL
Anion gap: 6 (ref 5–15)
BUN: 27 mg/dL — ABNORMAL HIGH (ref 6–20)
CO2: 25 mmol/L (ref 22–32)
CREATININE: 1.57 mg/dL — AB (ref 0.44–1.00)
Calcium: 9 mg/dL (ref 8.9–10.3)
Chloride: 104 mmol/L (ref 101–111)
GFR calc Af Amer: 38 mL/min — ABNORMAL LOW (ref >60)
GFR calc non Af Amer: 33 mL/min — ABNORMAL LOW (ref >60)
Glucose, Bld: 90 mg/dL (ref 65–99)
Potassium: 3.7 mmol/L (ref 3.5–5.1)
SODIUM: 135 mmol/L (ref 135–145)

## 2015-02-14 LAB — CBC
HCT: 32.5 % — ABNORMAL LOW (ref 35.0–47.0)
Hemoglobin: 10.7 g/dL — ABNORMAL LOW (ref 12.0–16.0)
MCH: 28.6 pg (ref 26.0–34.0)
MCHC: 32.9 g/dL (ref 32.0–36.0)
MCV: 87.1 fL (ref 80.0–100.0)
Platelets: 291 10*3/uL (ref 150–440)
RBC: 3.73 MIL/uL — ABNORMAL LOW (ref 3.80–5.20)
RDW: 18.4 % — ABNORMAL HIGH (ref 11.5–14.5)
WBC: 12.2 10*3/uL — ABNORMAL HIGH (ref 3.6–11.0)

## 2015-02-14 LAB — TROPONIN I
Troponin I: <0.03 ng/mL (ref <0.031)
Troponin I: <0.03 ng/mL (ref <0.031)

## 2015-02-14 MED ORDER — ASPIRIN 81 MG PO CHEW
CHEWABLE_TABLET | ORAL | Status: AC
  Administered 2015-02-14: 324 mg
  Filled 2015-02-14: qty 4

## 2015-02-14 MED ORDER — CLONIDINE HCL 0.1 MG PO TABS
0.1000 mg | ORAL_TABLET | Freq: Once | ORAL | Status: AC
Start: 2015-02-14 — End: 2015-02-14
  Administered 2015-02-14: 0.2 mg via ORAL

## 2015-02-14 MED ORDER — CLONIDINE HCL 0.1 MG PO TABS
ORAL_TABLET | ORAL | Status: AC
  Administered 2015-02-14: 0.2 mg via ORAL
  Filled 2015-02-14: qty 2

## 2015-02-14 NOTE — Discharge Instructions (Signed)

## 2015-02-14 NOTE — ED Provider Notes (Signed)
-----------------------------------------   7:40 AM on 02/14/2015 -----------------------------------------  Assuming care from Dr. Manson PasseyBrown.  In short, Laura Duran is a 69 y.o. female with a chief complaint of Hypertension .  Refer to the original H&P for additional details.  The current plan of care is to follow-up second troponin and urinalysis. If normal patient able to be discharged  ----------------------------------------- 8:42 AM on 02/14/2015 -----------------------------------------  Second troponin normal urinalysis normal. Blood pressure 120 systolic. Patient feeling improved and ready for discharge  Laura Everyobert Chery Giusto, MD 02/14/15 [phone]

## 2015-02-14 NOTE — ED Provider Notes (Signed)
[facility] Emergency Department Provider Note    ____________________________________________  Time seen: 5:30 AM  I have reviewed the triage vital signs and the nursing notes.   HISTORY  Chief Complaint Hypertension       HPI Ardeen Jourdainundrue E Birnbaum is a 69 y.o. female presents with a symptomatically hypertension noted at home. Patient stated systolic blood pressure was greater than 200 at 1 AM and on repeat at 2 AM. Patient denies chest pain shortness of breath headache or dizziness patient stated that she checked her pressure because she could hear her heartbeat in her ear which is  usually the case with her blood pressures elevated.Patient took home dose of hydralazine prior to presentation to the emergency department  Past Medical History  Diagnosis Date  . Arthritis   . Hypertension   . Hyperlipidemia   . Gout   . Heart attack 1997, 2001    Dr. Juliann Paresallwood every 6 month  . Angina at rest   . CAD (coronary artery disease)   . Paroxysmal atrial fibrillation   . Systolic murmur     Left sternal border  . PVD (peripheral vascular disease)   . Chronic renal insufficiency   . Bilateral carotid bruits   . CHF (congestive heart failure)     history of  . Colon polyps   . Obesity     Patient Active Problem List   Diagnosis Date Noted  . Acute post-hemorrhagic anemia 11/21/2014  . Wheezing 11/21/2014  . Hospital discharge follow-up 11/09/2014  . Urine leukocytes 11/02/2014  . Other fatigue 11/02/2014  . Back pain without radiculopathy 10/01/2014  . Cough 10/01/2014  . Gout 10/01/2014  . Essential hypertension 04/05/2014  . Medicare annual wellness visit, subsequent 04/05/2014  . Screening for breast cancer 04/05/2014  . Screening for skin cancer 04/05/2014  . Screening for osteoporosis 04/05/2014  . Need for TD vaccine 04/05/2014  . HLD (hyperlipidemia) 02/09/2014    Past Surgical History  Procedure Laterality Date  . Coronary artery bypass  graft  1997    x 4. [facility]  . 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 [facility]    Current Outpatient Rx  Name  Route  Sig  Dispense  Refill  . albuterol (PROVENTIL HFA;VENTOLIN HFA) 108 (90 BASE) MCG/ACT inhaler   Inhalation   Inhale 2 puffs into the lungs every 6 (six) hours as needed for wheezing or shortness of breath.   1 Inhaler   2   . ALPRAZolam (XANAX) 0.5 MG tablet   Oral   Take 1 tablet (0.5 mg total) by mouth every 12 (twelve) hours as needed.   60 tablet   0     Not to exceed 5 additional fills before 04/20/2015 ...   . amiodarone (PACERONE) 200 MG tablet   Oral   Take 200 mg by mouth daily.         Marland Kitchen. apixaban (ELIQUIS) 5 MG TABS tablet   Oral   Take 5 mg by mouth 2 (two) times daily.         Marland Kitchen. aspirin 81 MG tablet   Oral   Take 81 mg by mouth daily.         . carvedilol (COREG) 25 MG tablet   Oral   Take 1 tablet (25 mg total) by mouth 2 (two) times daily.   180 tablet   1   . folic acid (FOLVITE) 1 MG tablet   Oral   Take 1  tablet (1 mg total) by mouth daily.   30 tablet   5   . furosemide (LASIX) 20 MG tablet   Oral   Take 1 tablet (20 mg total) by mouth daily. Once a day as needed for edema   90 tablet   1   . hydrALAZINE (APRESOLINE) 25 MG tablet   Oral   Take 1 tablet (25 mg total) by mouth 3 (three) times daily.   270 tablet   1   . isosorbide mononitrate (IMDUR) 60 MG 24 hr tablet      TAKE 1 TABLET BY MOUTH TWICE A DAY   60 tablet   1   . losartan (COZAAR) 50 MG tablet   Oral   Take 1 tablet (50 mg total) by mouth daily.   90 tablet   1   . polyethylene glycol powder (GLYCOLAX/MIRALAX) powder   Oral   Take 17 g by mouth. Take 1 heaping tablespoon by mouth daily.         . pravastatin (PRAVACHOL) 20 MG tablet   Oral   Take 1 tablet (20 mg total) by mouth daily.   90 tablet   1   . ranolazine (RANEXA) 500 MG 12 hr tablet   Oral   Take 1 tablet (500 mg total) by mouth 2 (two) times daily.   60 tablet    3   . traMADol (ULTRAM) 50 MG tablet   Oral   Take 1 tablet (50 mg total) by mouth every 12 (twelve) hours as needed.   60 tablet   0     Allergies Vicodin  Family History  Problem Relation Age of Onset  . Diabetes Mother   . Kidney disease Father     Kidney tumor  . Hypertension Sister     Social History History  Substance Use Topics  . Smoking status: Former Games developermoker  . Smokeless tobacco: Never Used  . Alcohol Use: No    Review of Systems  Constitutional: Negative for fever. Eyes: Negative for visual changes. ENT: Negative for sore throat. Cardiovascular: Negative for chest pain. Respiratory: Negative for shortness of breath. Gastrointestinal: Negative for abdominal pain, vomiting and diarrhea. Genitourinary: Negative for dysuria. Musculoskeletal: Negative for back pain. Skin: Negative for rash. Neurological: Negative for headaches, focal weakness or numbness.   10-point ROS otherwise negative.  ____________________________________________   PHYSICAL EXAM:  VITAL SIGNS: ED Triage Vitals  Enc Vitals Group     BP 02/14/15 0311 207/71 mmHg     Pulse Rate 02/14/15 0311 66     Resp 02/14/15 0529 20     Temp 02/14/15 0311 97.5 F (36.4 C)     Temp Source 02/14/15 0311 Oral     SpO2 02/14/15 0311 100 %     Weight 02/14/15 0313 169 lb (76.658 kg)     Height 02/14/15 0313 5\' 2"  (1.575 m)     Head Cir --      Peak Flow --      Pain Score 02/14/15 0529 0     Pain Loc --      Pain Edu? --      Excl. in GC? --      Constitutional: Alert and oriented. Well appearing and in no distress. Eyes: Conjunctivae are normal. PERRL. Normal extraocular movements. ENT   Head: Normocephalic and atraumatic.   Nose: No congestion/rhinnorhea.   Mouth/Throat: Mucous membranes are moist.   Neck: No stridor. Hematological/Lymphatic/Immunilogical: No cervical lymphadenopathy. Cardiovascular: Normal rate, regular rhythm. Normal  and symmetric distal pulses are  present in all extremities. No murmurs, rubs, or gallops. Respiratory: Normal respiratory effort without tachypnea nor retractions. Breath sounds are clear and equal bilaterally. No wheezes/rales/rhonchi. Gastrointestinal: Soft and nontender. No distention. No abdominal bruits. There is no CVA tenderness. Genitourinary: deferred Musculoskeletal: Nontender with normal range of motion in all extremities. No joint effusions.  No lower extremity tenderness nor edema.left BKA Neurologic:  Normal speech and language. No gross focal neurologic deficits are appreciated. Speech is normal. No gait instability. Skin:  Skin is warm, dry and intact. No rash noted. Psychiatric: Mood and affect are normal. Speech and behavior are normal. Patient exhibits appropriate insight and judgment.  ____________________________________________    LABS (pertinent positives/negatives)  Lab data unremarkable except for creatinine of 1.57 patient aware of abnormal creatinine prior to presentation.  ____________________________________________   EKG  Date: 02/14/2015  Rate: 62  Rhythm: normal sinus rhythm  QRS Axis: normal  Intervals: normal  ST/T Wave abnormalities: normal  Conduction Disutrbances: none  Narrative Interpretation: unremarkable      ____________________________________________    RADIOLOGY  Negative  ____________________________________________   PROCEDURES     ____________________________________________   INITIAL IMPRESSION / ASSESSMENT AND PLAN / ED COURSE  Pertinent labs & imaging results that were available during my care of the patient were reviewed by me and considered in my medical decision making (see chart for details). Given H&P suspect asymptomatic hypertension. Patient received clonidine 0.1 mg in the emergency Department with improvement of blood pressure: BP 179/62. Of note during ED stay patient describe mild chest tightness such troponin will be repeated  initial troponin was negative.  ____________________________________________   FINAL CLINICAL IMPRESSION(S) / ED DIAGNOSES  Final diagnoses:  Essential hypertension     Darci Current, MD 02/14/15 [phone]

## 2015-02-14 NOTE — ED Notes (Addendum)
Patient ambulatory to triage with steady gait, without difficulty or distress noted; pt reports BP elevated at home; denies any c/o at present; reports "can feel pulse beating"

## 2015-02-14 NOTE — ED Notes (Signed)
NAD. VSS. Pt verbalized understanding of discharge.

## 2015-02-14 NOTE — ED Notes (Signed)
Patient alert and oriented. Bed low position. Rails up X 2. Call bell in reach. Family at bedside. Skin warm dry and pink. Respirations unlabored. NAD. Pulses + 3 bilateral radial.

## 2015-02-14 NOTE — ED Notes (Addendum)
Pt's BP rechecked due to chest tightness and HA and BP has come down some 196/78 MD made aware.

## 2015-02-14 NOTE — ED Notes (Signed)
MD at bedside. 

## 2015-02-16 ENCOUNTER — Other Ambulatory Visit: Payer: Self-pay

## 2015-02-16 ENCOUNTER — Encounter: Payer: Self-pay | Admitting: *Deleted

## 2015-02-16 ENCOUNTER — Observation Stay
Admission: EM | Admit: 2015-02-16 | Discharge: 2015-02-17 | Disposition: A | Discharge status: Home / Self Care | Payer: Medicare Other | Attending: Internal Medicine | Admitting: Internal Medicine

## 2015-02-16 DIAGNOSIS — Z79899 Other long term (current) drug therapy: Secondary | ICD-10-CM | POA: Diagnosis not present

## 2015-02-16 DIAGNOSIS — Z86010 Personal history of colonic polyps: Secondary | ICD-10-CM | POA: Insufficient documentation

## 2015-02-16 DIAGNOSIS — Z833 Family history of diabetes mellitus: Secondary | ICD-10-CM | POA: Diagnosis not present

## 2015-02-16 DIAGNOSIS — E785 Hyperlipidemia, unspecified: Secondary | ICD-10-CM | POA: Diagnosis not present

## 2015-02-16 DIAGNOSIS — I480 Paroxysmal atrial fibrillation: Secondary | ICD-10-CM | POA: Insufficient documentation

## 2015-02-16 DIAGNOSIS — N183 Chronic kidney disease, stage 3 (moderate): Secondary | ICD-10-CM | POA: Insufficient documentation

## 2015-02-16 DIAGNOSIS — Z89512 Acquired absence of left leg below knee: Secondary | ICD-10-CM | POA: Diagnosis not present

## 2015-02-16 DIAGNOSIS — Z841 Family history of disorders of kidney and ureter: Secondary | ICD-10-CM | POA: Insufficient documentation

## 2015-02-16 DIAGNOSIS — I5020 Unspecified systolic (congestive) heart failure: Secondary | ICD-10-CM | POA: Diagnosis not present

## 2015-02-16 DIAGNOSIS — R079 Chest pain, unspecified: Secondary | ICD-10-CM | POA: Diagnosis present

## 2015-02-16 DIAGNOSIS — Z8249 Family history of ischemic heart disease and other diseases of the circulatory system: Secondary | ICD-10-CM | POA: Diagnosis not present

## 2015-02-16 DIAGNOSIS — Z885 Allergy status to narcotic agent status: Secondary | ICD-10-CM | POA: Diagnosis not present

## 2015-02-16 DIAGNOSIS — Z7902 Long term (current) use of antithrombotics/antiplatelets: Secondary | ICD-10-CM | POA: Diagnosis not present

## 2015-02-16 DIAGNOSIS — Z87891 Personal history of nicotine dependence: Secondary | ICD-10-CM | POA: Insufficient documentation

## 2015-02-16 DIAGNOSIS — I739 Peripheral vascular disease, unspecified: Secondary | ICD-10-CM | POA: Insufficient documentation

## 2015-02-16 DIAGNOSIS — I2510 Atherosclerotic heart disease of native coronary artery without angina pectoris: Secondary | ICD-10-CM | POA: Insufficient documentation

## 2015-02-16 DIAGNOSIS — M1990 Unspecified osteoarthritis, unspecified site: Secondary | ICD-10-CM | POA: Insufficient documentation

## 2015-02-16 DIAGNOSIS — E669 Obesity, unspecified: Secondary | ICD-10-CM | POA: Diagnosis not present

## 2015-02-16 DIAGNOSIS — I10 Essential (primary) hypertension: Secondary | ICD-10-CM | POA: Diagnosis present

## 2015-02-16 DIAGNOSIS — I252 Old myocardial infarction: Secondary | ICD-10-CM | POA: Diagnosis not present

## 2015-02-16 DIAGNOSIS — I129 Hypertensive chronic kidney disease with stage 1 through stage 4 chronic kidney disease, or unspecified chronic kidney disease: Secondary | ICD-10-CM | POA: Diagnosis not present

## 2015-02-16 DIAGNOSIS — Z951 Presence of aortocoronary bypass graft: Secondary | ICD-10-CM | POA: Insufficient documentation

## 2015-02-16 DIAGNOSIS — I38 Endocarditis, valve unspecified: Secondary | ICD-10-CM | POA: Diagnosis not present

## 2015-02-16 DIAGNOSIS — I509 Heart failure, unspecified: Secondary | ICD-10-CM | POA: Diagnosis not present

## 2015-02-16 LAB — CBC
HCT: 34 % — ABNORMAL LOW (ref 35.0–47.0)
HCT: 32.7 % — ABNORMAL LOW (ref 35.0–47.0)
HEMOGLOBIN: 10.5 g/dL — AB (ref 12.0–16.0)
Hemoglobin: 10.9 g/dL — ABNORMAL LOW (ref 12.0–16.0)
MCH: 28.4 pg (ref 26.0–34.0)
MCH: 28.5 pg (ref 26.0–34.0)
MCHC: 32.1 g/dL (ref 32.0–36.0)
MCHC: 32.2 g/dL (ref 32.0–36.0)
MCV: 88.4 fL (ref 80.0–100.0)
MCV: 88.4 fL (ref 80.0–100.0)
PLATELETS: 288 10*3/uL (ref 150–440)
Platelets: 279 10*3/uL (ref 150–440)
RBC: 3.85 MIL/uL (ref 3.80–5.20)
RBC: 3.7 MIL/uL — AB (ref 3.80–5.20)
RDW: 18.1 % — ABNORMAL HIGH (ref 11.5–14.5)
RDW: 17.9 % — ABNORMAL HIGH (ref 11.5–14.5)
WBC: 10.7 10*3/uL (ref 3.6–11.0)
WBC: 10.5 10*3/uL (ref 3.6–11.0)

## 2015-02-16 LAB — COMPREHENSIVE METABOLIC PANEL
ALBUMIN: 3.3 g/dL — AB (ref 3.5–5.0)
ALK PHOS: 72 U/L (ref 38–126)
ALT: 17 U/L (ref 14–54)
AST: 14 U/L — AB (ref 15–41)
Anion gap: 8 (ref 5–15)
BUN: 28 mg/dL — AB (ref 6–20)
CHLORIDE: 105 mmol/L (ref 101–111)
CO2: 23 mmol/L (ref 22–32)
Calcium: 9.1 mg/dL (ref 8.9–10.3)
Creatinine, Ser: 1.5 mg/dL — ABNORMAL HIGH (ref 0.44–1.00)
GFR calc Af Amer: 40 mL/min — ABNORMAL LOW (ref >60)
GFR, EST NON AFRICAN AMERICAN: 34 mL/min — AB (ref >60)
Glucose, Bld: 94 mg/dL (ref 65–99)
Potassium: 3.6 mmol/L (ref 3.5–5.1)
SODIUM: 136 mmol/L (ref 135–145)
Total Bilirubin: 0.4 mg/dL (ref 0.3–1.2)
Total Protein: 6.5 g/dL (ref 6.5–8.1)

## 2015-02-16 LAB — TROPONIN I
Troponin I: <0.03 ng/mL (ref <0.031)
Troponin I: <0.03 ng/mL (ref <0.031)

## 2015-02-16 LAB — BASIC METABOLIC PANEL
ANION GAP: 8 (ref 5–15)
BUN: 29 mg/dL — ABNORMAL HIGH (ref 6–20)
CO2: 24 mmol/L (ref 22–32)
Calcium: 9.2 mg/dL (ref 8.9–10.3)
Chloride: 105 mmol/L (ref 101–111)
Creatinine, Ser: 1.6 mg/dL — ABNORMAL HIGH (ref 0.44–1.00)
GFR calc Af Amer: 37 mL/min — ABNORMAL LOW (ref >60)
GFR calc non Af Amer: 32 mL/min — ABNORMAL LOW (ref >60)
Glucose, Bld: 91 mg/dL (ref 65–99)
Potassium: 3.6 mmol/L (ref 3.5–5.1)
Sodium: 137 mmol/L (ref 135–145)

## 2015-02-16 MED ORDER — NITROGLYCERIN 2 % TD OINT
1.0000 [in_us] | TOPICAL_OINTMENT | Freq: Four times a day (QID) | TRANSDERMAL | Status: DC
  Administered 2015-02-16 (×2): 1 [in_us] via TOPICAL
  Filled 2015-02-16 (×2): qty 1

## 2015-02-16 MED ORDER — CLOPIDOGREL BISULFATE 75 MG PO TABS
75.0000 mg | ORAL_TABLET | Freq: Every day | ORAL | Status: DC
  Administered 2015-02-16 – 2015-02-17 (×2): 75 mg via ORAL
  Filled 2015-02-16 (×2): qty 1

## 2015-02-16 MED ORDER — ASPIRIN EC 81 MG PO TBEC
81.0000 mg | DELAYED_RELEASE_TABLET | Freq: Every day | ORAL | Status: DC
  Administered 2015-02-16 – 2015-02-17 (×2): 81 mg via ORAL
  Filled 2015-02-16 (×2): qty 1

## 2015-02-16 MED ORDER — ACETAMINOPHEN 325 MG PO TABS
650.0000 mg | ORAL_TABLET | Freq: Four times a day (QID) | ORAL | Status: DC | PRN
  Administered 2015-02-16: 650 mg via ORAL
  Filled 2015-02-16: qty 2

## 2015-02-16 MED ORDER — LOSARTAN POTASSIUM 25 MG PO TABS
ORAL_TABLET | ORAL | Status: AC
  Administered 2015-02-16: 50 mg via ORAL
  Filled 2015-02-16: qty 2

## 2015-02-16 MED ORDER — FOLIC ACID 1 MG PO TABS
1.0000 mg | ORAL_TABLET | Freq: Every day | ORAL | Status: DC
  Administered 2015-02-16 – 2015-02-17 (×2): 1 mg via ORAL
  Filled 2015-02-16 (×2): qty 1

## 2015-02-16 MED ORDER — CLONIDINE HCL 0.1 MG PO TABS
0.2000 mg | ORAL_TABLET | Freq: Three times a day (TID) | ORAL | Status: DC
  Administered 2015-02-16: 0.2 mg via ORAL
  Filled 2015-02-16 (×4): qty 2

## 2015-02-16 MED ORDER — AMLODIPINE BESYLATE 5 MG PO TABS
5.0000 mg | ORAL_TABLET | Freq: Every day | ORAL | Status: DC
  Administered 2015-02-17: 5 mg via ORAL
  Filled 2015-02-16: qty 1

## 2015-02-16 MED ORDER — ALBUTEROL SULFATE (2.5 MG/3ML) 0.083% IN NEBU: 2.5000 mg | INHALATION_SOLUTION | Freq: Four times a day (QID) | RESPIRATORY_TRACT | Status: DC | PRN

## 2015-02-16 MED ORDER — HYDRALAZINE HCL 25 MG PO TABS
ORAL_TABLET | ORAL | Status: AC
  Administered 2015-02-16: 25 mg via ORAL
  Filled 2015-02-16: qty 1

## 2015-02-16 MED ORDER — MORPHINE SULFATE 2 MG/ML IJ SOLN
INTRAMUSCULAR | Status: AC
  Administered 2015-02-16: 2 mg via INTRAVENOUS
  Filled 2015-02-16: qty 1

## 2015-02-16 MED ORDER — NITROGLYCERIN 2 % TD OINT
TOPICAL_OINTMENT | TRANSDERMAL | Status: AC
Start: 2015-02-16 — End: 2015-02-16
  Filled 2015-02-16: qty 1

## 2015-02-16 MED ORDER — LOSARTAN POTASSIUM 50 MG PO TABS
50.0000 mg | ORAL_TABLET | Freq: Every day | ORAL | Status: DC
  Administered 2015-02-16 – 2015-02-17 (×3): 50 mg via ORAL
  Filled 2015-02-16 (×2): qty 1

## 2015-02-16 MED ORDER — TRAMADOL HCL 50 MG PO TABS
ORAL_TABLET | ORAL | Status: AC
  Filled 2015-02-16: qty 1

## 2015-02-16 MED ORDER — ALPRAZOLAM 0.5 MG PO TABS
0.5000 mg | ORAL_TABLET | Freq: Two times a day (BID) | ORAL | Status: DC | PRN
  Administered 2015-02-16: 0.5 mg via ORAL
  Filled 2015-02-16: qty 1

## 2015-02-16 MED ORDER — ALBUTEROL SULFATE HFA 108 (90 BASE) MCG/ACT IN AERS: 2.0000 | INHALATION_SPRAY | Freq: Four times a day (QID) | RESPIRATORY_TRACT | Status: DC | PRN

## 2015-02-16 MED ORDER — RANOLAZINE ER 500 MG PO TB12
500.0000 mg | ORAL_TABLET | Freq: Two times a day (BID) | ORAL | Status: DC
  Administered 2015-02-16 – 2015-02-17 (×3): 500 mg via ORAL
  Filled 2015-02-16 (×3): qty 1

## 2015-02-16 MED ORDER — AMIODARONE HCL 200 MG PO TABS
200.0000 mg | ORAL_TABLET | Freq: Every day | ORAL | Status: DC
  Administered 2015-02-16 – 2015-02-17 (×2): 200 mg via ORAL
  Filled 2015-02-16 (×2): qty 1

## 2015-02-16 MED ORDER — MORPHINE SULFATE 2 MG/ML IJ SOLN
2.0000 mg | INTRAMUSCULAR | Status: DC | PRN
  Administered 2015-02-16 – 2015-02-17 (×3): 2 mg via INTRAVENOUS
  Filled 2015-02-16 (×3): qty 1

## 2015-02-16 MED ORDER — TRAMADOL HCL 50 MG PO TABS
50.0000 mg | ORAL_TABLET | Freq: Two times a day (BID) | ORAL | Status: DC | PRN
  Filled 2015-02-16: qty 1

## 2015-02-16 MED ORDER — PRAVASTATIN SODIUM 20 MG PO TABS
20.0000 mg | ORAL_TABLET | Freq: Every day | ORAL | Status: DC
  Administered 2015-02-16 – 2015-02-17 (×2): 20 mg via ORAL
  Filled 2015-02-16 (×2): qty 1

## 2015-02-16 MED ORDER — HEPARIN SODIUM (PORCINE) 5000 UNIT/ML IJ SOLN
5000.0000 [IU] | Freq: Three times a day (TID) | INTRAMUSCULAR | Status: DC
  Administered 2015-02-16 – 2015-02-17 (×4): 5000 [IU] via SUBCUTANEOUS
  Filled 2015-02-16 (×4): qty 1

## 2015-02-16 MED ORDER — HYDRALAZINE HCL 25 MG PO TABS
25.0000 mg | ORAL_TABLET | Freq: Three times a day (TID) | ORAL | Status: DC
  Administered 2015-02-16 – 2015-02-17 (×4): 25 mg via ORAL
  Filled 2015-02-16 (×4): qty 1

## 2015-02-16 MED ORDER — NITROGLYCERIN 2 % TD OINT
0.5000 [in_us] | TOPICAL_OINTMENT | Freq: Once | TRANSDERMAL | Status: AC
  Administered 2015-02-16: 0.5 [in_us] via TOPICAL

## 2015-02-16 MED ORDER — DOCUSATE SODIUM 100 MG PO CAPS
100.0000 mg | ORAL_CAPSULE | Freq: Two times a day (BID) | ORAL | Status: DC
  Administered 2015-02-16 – 2015-02-17 (×3): 100 mg via ORAL
  Filled 2015-02-16 (×3): qty 1

## 2015-02-16 MED ORDER — SODIUM CHLORIDE 0.9 % IJ SOLN
3.0000 mL | Freq: Two times a day (BID) | INTRAMUSCULAR | Status: DC
  Administered 2015-02-16 (×3): 3 mL via INTRAVENOUS

## 2015-02-16 MED ORDER — CARVEDILOL 25 MG PO TABS
25.0000 mg | ORAL_TABLET | Freq: Two times a day (BID) | ORAL | Status: DC
  Administered 2015-02-16 – 2015-02-17 (×3): 25 mg via ORAL
  Filled 2015-02-16 (×3): qty 1

## 2015-02-16 MED ORDER — FUROSEMIDE 20 MG PO TABS
20.0000 mg | ORAL_TABLET | Freq: Every day | ORAL | Status: DC
  Administered 2015-02-16 – 2015-02-17 (×2): 20 mg via ORAL
  Filled 2015-02-16 (×2): qty 1

## 2015-02-16 MED ORDER — MORPHINE SULFATE 2 MG/ML IJ SOLN
2.0000 mg | Freq: Once | INTRAMUSCULAR | Status: AC
  Administered 2015-02-16: 2 mg via INTRAVENOUS

## 2015-02-16 NOTE — Progress Notes (Signed)
Pt having good relief of chest  Pain with morphine. req tylenol for nitrate headache. Blood pressure much improved post meds. Pt remains alerta nd oriented. Visitors in . Plan ;for possible discharge tomorrow. . me

## 2015-02-16 NOTE — ED Notes (Signed)
Pt presents w/ c/o chest tightness that is recurring. Tightness reproducible w/ palpation, inspiration, and repositioning of arms. Pt denies associated sxs. Pt in no acute distress at this time.

## 2015-02-16 NOTE — Progress Notes (Signed)
[facility] [place] [facility]                                                                                                                                                                                            Patient Demographics   Laura Duran, is a 69 y.o. female, DOB - 19-May-1946, ZOX:[id number]  Admit date - 02/16/2015   Admitting Physician Arnaldo Natal, MD  Outpatient Primary MD for the patient is Derwood Kaplan, MD    Chief Complaint  Patient presents with  . Chest Pain   Pt admited with cp bp elevated, cez neg, ekg non reveling, still has cp, no sob  Review of Systems:   CONSTITUTIONAL: No documented fever. No fatigue, weakness. No weight gain, no weight loss.  EYES: No blurry or double vision.  ENT: No tinnitus. No postnasal drip. No redness of the oropharynx.  RESPIRATORY: No cough, no wheeze, no hemoptysis. No dyspnea.  CARDIOVASCULAR: No chest pain. No orthopnea. No palpitations. No syncope.  GASTROINTESTINAL: No nausea, no vomiting or diarrhea. No abdominal pain. No melena or hematochezia.  GENITOURINARY: No dysuria or hematuria.  ENDOCRINE: No polyuria or nocturia. No heat or cold intolerance.  HEMATOLOGY: No anemia. No bruising. No bleeding.  INTEGUMENTARY: No rashes. No lesions.  MUSCULOSKELETAL: No arthritis. No swelling. No gout.  NEUROLOGIC: No numbness, tingling, or ataxia. No seizure-type activity.  PSYCHIATRIC: No anxiety. No insomnia. No ADD.    Vitals:   Filed Vitals:   02/16/15 0610 02/16/15 0658 02/16/15 0715 02/16/15 1118  BP:  191/58 234/60 110/33  Pulse: 73 72 64 51  Temp:   98 F (36.7 C) 98 F (36.7 C)  TempSrc:   Oral Oral  Resp:  Height:      Weight:   86.682 kg (191 lb 1.6 oz)   SpO2:  98% 100% 98%    Wt Readings from Last 3 Encounters:  02/16/15 86.682 kg (191 lb 1.6 oz)  02/14/15 76.658 kg (169 lb)  11/21/14 91.354 kg (201 lb 6.4 oz)     Intake/Output Summary (Last 24  hours) at 02/16/15 1316 Last data filed at 02/16/15 1130  Gross per 24 hour  Intake      0 ml  Output    350 ml  Net   -350 ml    Physical Exam:   GENERAL: Pleasant-appearing in no apparent distress.  HEAD, EYES, EARS, NOSE AND THROAT: Atraumatic, normocephalic. Extraocular muscles are intact. Pupils equal and reactive to light. Sclerae anicteric. No conjunctival injection. No oro-pharyngeal erythema.  NECK: Supple. There is no jugular  venous distention. No bruits, no lymphadenopathy, no thyromegaly.  HEART: Regular rate and rhythm, tachycardic. No murmurs, no rubs, no clicks.  LUNGS: Clear to auscultation bilaterally. No rales or rhonchi. No wheezes.  ABDOMEN: Soft, flat, nontender, nondistended. Has good bowel sounds. No hepatosplenomegaly appreciated.  EXTREMITIES: No evidence of any cyanosis, clubbing, or peripheral edema.  +2 pedal and radial pulses bilaterally.  NEUROLOGIC: The patient is alert, awake, and oriented x3 with no focal motor or sensory deficits appreciated bilaterally.  SKIN: Moist and warm with no rashes appreciated.     Antibiotics   Anti-infectives    None      Medications   Scheduled Meds: . amiodarone  200 mg Oral Daily  . amLODipine  5 mg Oral Daily  . aspirin EC  81 mg Oral Daily  . carvedilol  25 mg Oral BID  . cloNIDine  0.2 mg Oral TID  . clopidogrel  75 mg Oral Daily  . docusate sodium  100 mg Oral BID  . folic acid  1 mg Oral Daily  . furosemide  20 mg Oral Daily  . heparin  5,000 Units Subcutaneous 3 times per day  . hydrALAZINE  25 mg Oral TID  . losartan  50 mg Oral Daily  . nitroGLYCERIN  1 inch Topical 4 times per day  . pravastatin  20 mg Oral Daily  . ranolazine  500 mg Oral BID  . sodium chloride  3 mL Intravenous Q12H   Continuous Infusions:  PRN Meds:.albuterol, ALPRAZolam, morphine injection, traMADol   Data Review:   Micro Results No results found for this or any previous visit (from the past 240 hour(s)).  Radiology  Reports No results found.   CBC  Recent Labs Lab 02/14/15 0321 02/16/15 0330 02/16/15 0540  WBC 12.2* 10.7 10.5  HGB 10.7* 10.9* 10.5*  HCT 32.5* 34.0* 32.7*  PLT 291 288 279  MCV 87.1 88.4 88.4  MCH 28.6 28.4 28.5  MCHC 32.9 32.1 32.2  RDW 18.4* 18.1* 17.9*    Chemistries   Recent Labs Lab 02/14/15 0321 02/16/15 0330 02/16/15 0540  NA 135 137 136  K 3.7 3.6 3.6  CL 104 105 105  CO2 25 24 23   GLUCOSE 90 91 94  BUN 27* 29* 28*  CREATININE 1.57* 1.60* 1.50*  CALCIUM 9.0 9.2 9.1  AST  --   --  14*  ALT  --   --  17  ALKPHOS  --   --  72  BILITOT  --   --  0.4   ------------------------------------------------------------------------------------------------------------------ estimated creatinine clearance is 36.2 mL/min (by C-G formula based on Cr of 1.5). ------------------------------------------------------------------------------------------------------------------ No results for input(s): HGBA1C in the last 72 hours. ------------------------------------------------------------------------------------------------------------------ No results for input(s): CHOL, HDL, LDLCALC, TRIG, CHOLHDL, LDLDIRECT in the last 72 hours. ------------------------------------------------------------------------------------------------------------------ No results for input(s): TSH, T4TOTAL, T3FREE, THYROIDAB in the last 72 hours.  Invalid input(s): FREET3 ------------------------------------------------------------------------------------------------------------------ No results for input(s): VITAMINB12, FOLATE, FERRITIN, TIBC, IRON, RETICCTPCT in the last 72 hours.  Coagulation profile No results for input(s): INR, PROTIME in the last 168 hours.  No results for input(s): DDIMER in the last 72 hours.  Cardiac Enzymes  Recent Labs Lab 02/14/15 0741 02/16/15 0330 02/16/15 0540  TROPONINI <0.03 <0.03 <0.03    ------------------------------------------------------------------------------------------------------------------ Invalid input(s): POCBNP    Assessment & Plan   Active Problems:   Essential hypertension   Chest pain Assessment/Plan This is a 69 year old female with known coronary artery disease admitted for persistent chest pain. 1. Chest pain:  This is likely due to accelerated hypertension. Cardiac enzymes so far has been not significantly elevated. We will try to control her blood pressure better monitor for any further anginal symptoms. She has been seen by her her cardiologist who recommends monitoring her for time being.  2. Accelerated Hypertension: I will started on increase her nitroglycerin patch. I have also started on clonidine. Continue losartan and hydralazine as taking at home.  3. Congestive heart failure, systolic: Compensated. There is no evidence of exacerbation at this time. Continue maintenance dose of Lasix and carvedilol 4. Paroxysmal atrial fibrillation: Continue amiodarone 5. Hyperlipidemia: Continue statin therapy 6. DVT prophylaxis: heparin 7. GI prophylaxis: none The patient is a full code. Time spent on admission orders and patient care approximately 45 minutes     Code Status Orders        Start     Ordered   02/16/15 0725  Full code   Continuous     02/16/15 0724      Family Communication: d/w patient  Disposition Plan: home  Procedures    Consults     DVT Prophylaxis  Lovenox    Lab Results  Component Value Date   PLT 279 02/16/2015     Time Spent in minutes 45min   Auburn BilberryPATEL, Rini Moffit M.D on 02/16/2015 at 1:16 PM  Between 7am to 6pm - Pager - [phone]  After 6pm go to [email or web address] - password [email or web address]  [facility]   Office  [phone]

## 2015-02-16 NOTE — ED Provider Notes (Signed)
[facility] Emergency Department Provider Note    ____________________________________________  Time seen: 4:30 AM  I have reviewed the triage vital signs and the nursing notes.   HISTORY  Chief Complaint Chest Pain       HPI Laura Duran is a 69 y.o. female presents with persistent hypertension at home. In addition patient admits to central nonradiating substernal 7 out of 10 chest pressure since 1 AM this morning. Patient denies any shortness of breath and dizziness no nausea no vomiting. of note patient was seen in the emergency department yesterday for hypertension BP prior to discharge is 122/55.Blood pressure on presentation today to 29/58 at present 209/57. In addition patient has a history of coronary artery bypass graft and valvular replacement 20 years ago. Patient underwent stent placement one month ago.     Past Medical History  Diagnosis Date  . Arthritis   . Hypertension   . Hyperlipidemia   . Gout   . Heart attack 1997, 2001    Dr. Juliann Paresallwood every 6 month  . Angina at rest   . CAD (coronary artery disease)   . Paroxysmal atrial fibrillation   . Systolic murmur     Left sternal border  . PVD (peripheral vascular disease)   . Chronic renal insufficiency   . Bilateral carotid bruits   . CHF (congestive heart failure)     history of  . Colon polyps   . Obesity     Patient Active Problem List   Diagnosis Date Noted  . Chest pain 02/16/2015  . Acute post-hemorrhagic anemia 11/21/2014  . Wheezing 11/21/2014  . Hospital discharge follow-up 11/09/2014  . Urine leukocytes 11/02/2014  . Other fatigue 11/02/2014  . Back pain without radiculopathy 10/01/2014  . Cough 10/01/2014  . Gout 10/01/2014  . Essential hypertension 04/05/2014  . Medicare annual wellness visit, subsequent 04/05/2014  . Screening for breast cancer 04/05/2014  . Screening for skin cancer 04/05/2014  . Screening for osteoporosis 04/05/2014  . Need for TD  vaccine 04/05/2014  . HLD (hyperlipidemia) 02/09/2014    Past Surgical History  Procedure Laterality Date  . Coronary artery bypass graft  1997    x 4. [facility]  . 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 [facility]    Current Outpatient Rx  Name  Route  Sig  Dispense  Refill  . ALPRAZolam (XANAX) 0.5 MG tablet   Oral   Take 1 tablet (0.5 mg total) by mouth every 12 (twelve) hours as needed.   60 tablet   0     Not to exceed 5 additional fills before 04/20/2015 ...   . amiodarone (PACERONE) 200 MG tablet   Oral   Take 200 mg by mouth daily.         Marland Kitchen. aspirin 81 MG tablet   Oral   Take 81 mg by mouth daily.         . carvedilol (COREG) 25 MG tablet   Oral   Take 1 tablet (25 mg total) by mouth 2 (two) times daily.   180 tablet   1   . clopidogrel (PLAVIX) 75 MG tablet   Oral   Take 75 mg by mouth daily.         . folic acid (FOLVITE) 1 MG tablet   Oral   Take 1 tablet (1 mg total) by mouth daily.   30 tablet   5   . furosemide (LASIX) 20 MG tablet  Oral   Take 1 tablet (20 mg total) by mouth daily. Once a day as needed for edema   90 tablet   1   . hydrALAZINE (APRESOLINE) 25 MG tablet   Oral   Take 1 tablet (25 mg total) by mouth 3 (three) times daily.   270 tablet   1   . isosorbide mononitrate (IMDUR) 60 MG 24 hr tablet      TAKE 1 TABLET BY MOUTH TWICE A DAY Patient taking differently: TAKE 1 TABLET BY MOUTH once a day   60 tablet   1   . losartan (COZAAR) 50 MG tablet   Oral   Take 1 tablet (50 mg total) by mouth daily.   90 tablet   1   . ranolazine (RANEXA) 500 MG 12 hr tablet   Oral   Take 1 tablet (500 mg total) by mouth 2 (two) times daily.   60 tablet   3   . albuterol (PROVENTIL HFA;VENTOLIN HFA) 108 (90 BASE) MCG/ACT inhaler   Inhalation   Inhale 2 puffs into the lungs every 6 (six) hours as needed for wheezing or shortness of breath. Patient not taking: Reported on 02/16/2015   1 Inhaler   2   . pravastatin  (PRAVACHOL) 20 MG tablet   Oral   Take 1 tablet (20 mg total) by mouth daily. Patient not taking: Reported on 02/16/2015   90 tablet   1   . traMADol (ULTRAM) 50 MG tablet   Oral   Take 1 tablet (50 mg total) by mouth every 12 (twelve) hours as needed.   60 tablet   0     Allergies Vicodin  Family History  Problem Relation Age of Onset  . Diabetes Mother   . Kidney disease Father     Kidney tumor  . Hypertension Sister     Social History History  Substance Use Topics  . Smoking status: Former Games developermoker  . Smokeless tobacco: Never Used  . Alcohol Use: No    Review of Systems  Constitutional: Negative for fever. Eyes: Negative for visual changes. ENT: Negative for sore throat. Cardiovascular: Positive for chest pain. Positive hypertension Respiratory: Negative for shortness of breath. Gastrointestinal: Negative for abdominal pain, vomiting and diarrhea. Genitourinary: Negative for dysuria. Musculoskeletal: Negative for back pain. Skin: Negative for rash. Neurological: Negative for headaches, focal weakness or numbness.   10-point ROS otherwise negative.  ____________________________________________   PHYSICAL EXAM:  VITAL SIGNS: ED Triage Vitals  Enc Vitals Group     BP 02/16/15 0325 229/58 mmHg     Pulse Rate 02/16/15 0325 62     Resp 02/16/15 0325 20     Temp --      Temp src --      SpO2 02/16/15 0325 99 %     Weight 02/16/15 0325 161 lb (73.029 kg)     Height 02/16/15 0325 5\' 2"  (1.575 m)     Head Cir --      Peak Flow --      Pain Score 02/16/15 0339 9     Pain Loc --      Pain Edu? --      Excl. in GC? --      Constitutional: Alert and oriented. Well appearing and in no distress. Eyes: Conjunctivae are normal. PERRL. Normal extraocular movements. ENT   Head: Normocephalic and atraumatic.   Nose: No congestion/rhinnorhea.   Mouth/Throat: Mucous membranes are moist.   Neck: No stridor. Hematological/Lymphatic/Immunilogical:  No  cervical lymphadenopathy. Cardiovascular: Normal rate, regular rhythm. Normal and symmetric distal pulses are present in all extremities. No murmurs, rubs, or gallops. Respiratory: Normal respiratory effort without tachypnea nor retractions. Breath sounds are clear and equal bilaterally. No wheezes/rales/rhonchi. Gastrointestinal: Soft and nontender. No distention. No abdominal bruits. There is no CVA tenderness. Genitourinary: Deferred Musculoskeletal: Nontender with normal range of motion in all extremities. No joint effusions.  No lower extremity tenderness nor edema. Neurologic:  Normal speech and language. No gross focal neurologic deficits are appreciated. Speech is normal. No gait instability. Skin:  Skin is warm, dry and intact. No rash noted. Psychiatric: Mood and affect are normal. Speech and behavior are normal. Patient exhibits appropriate insight and judgment.  ____________________________________________    LABS (pertinent positives/negatives)  Labs Reviewed  CBC - Abnormal; Notable for the following:    Hemoglobin 10.9 (*)    HCT 34.0 (*)    RDW 18.1 (*)    All other components within normal limits  BASIC METABOLIC PANEL - Abnormal; Notable for the following:    BUN 29 (*)    Creatinine, Ser 1.60 (*)    GFR calc non Af Amer 32 (*)    GFR calc Af Amer 37 (*)    All other components within normal limits  TROPONIN I  CBC  COMPREHENSIVE METABOLIC PANEL  TROPONIN I     ____________________________________________   EKG   Date: 02/16/2015  Rate: 57  Rhythm: Sinus bradycardia  QRS Axis: normal  Intervals: normal  ST/T Wave abnormalities: normal  Conduction Disutrbances: none  Narrative Interpretation: unremarkable      ____________________________________________    RADIOLOGY  Negative  ____________________________________________     ____________________________________________   INITIAL IMPRESSION / ASSESSMENT AND PLAN / ED  COURSE  Pertinent labs & imaging results that were available during my care of the patient were reviewed by me and considered in my medical decision making (see chart for details).  Given history and physical exam of chest pain with hypertension concerning for demand ischemia. In addition concern for possible subacute cardiac stent closure. Regarding hypertension patient received nitroglycerin paste half inch. Aspirin 324 mg. Patient will be admitted to the hospital for cardiac evaluation. ____________________________________________   FINAL CLINICAL IMPRESSION(S) / ED DIAGNOSES  Final diagnoses:  None     Darci Current, MD 02/16/15 [phone]

## 2015-02-16 NOTE — Consult Note (Signed)
[facility] Consultation Note  Patient ID: Laura Duran, MRN: [id number], DOB/AGE: 1946-02-13 69 y.o. Admit date: 02/16/2015   Date of Consult: 02/16/2015 Primary Physician: Derwood KaplanEason,  Ernest B, MD Primary Cardiologist: Dr. Vennie Homansalderwood  Chief Complaint:  Chief Complaint  Patient presents with  . Chest Pain   Reason for Consult: known coronary artery disease with chest discomfort  HPI: 69 y.o. female with known coronary artery disease status post PCI and stent placement of unknown artery last year. The patient has had appropriate medication management including tension control with carvedilol clonidine hydralazine losartan at this time. Recently she has had waxing and waning of malignant hypertension in addition to that she's had a chest heaviness occurring all the time. This is waxing and waning with associated shortness of breath. She is bending to the emergency room for which she has had an EKG showing normal sinus rhythm without evidence of acute myocardial infarction. She was also had elevated troponin level of 0.03 consistent with demand ischemia rather than acute coronary syndrome. The patient now has slightly elevated blood pressures on multiple medications as listed. She also is feeling much better with nitroglycerin. There is more concerns of hypertension and then at acute coronary syndrome and/or issues of a unstable angina. She does have atrial fibrillation for which she has been on appropriate medication management and maintaining normal sinus rhythm with amiodarone clonidine and carvedilol. The patient is not currently on anticoagulation due to concerns of multiple medications including aspirin and Plavix for previous PCI and stent placement. She has had kidney disease stage III with a GFR of 44 which she is receiving medication management listed below. This is being treated with furosemide and may be related to her current hypertension issues.  Past Medical History   Diagnosis Date  . Arthritis   . Hypertension   . Hyperlipidemia   . Gout   . Heart attack 1997, 2001    Dr. Juliann Paresallwood every 6 month  . Angina at rest   . CAD (coronary artery disease)   . Paroxysmal atrial fibrillation   . Systolic murmur     Left sternal border  . PVD (peripheral vascular disease)   . Chronic renal insufficiency   . Bilateral carotid bruits   . CHF (congestive heart failure)     history of  . Colon polyps   . Obesity       Most Recent Cardiac Studies:    Surgical History:  Past Surgical History  Procedure Laterality Date  . Coronary artery bypass graft  1997    x 4. [facility]  . 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 [facility]     Home Meds: Prior to Admission medications   Medication Sig Start Date End Date Taking? Authorizing Provider  ALPRAZolam Prudy Feeler(XANAX) 0.5 MG tablet Take 1 tablet (0.5 mg total) by mouth every 12 (twelve) hours as needed. 12/18/14  Yes Carollee Leitzarrie M Doss, NP  amiodarone (PACERONE) 200 MG tablet Take 200 mg by mouth daily.   Yes Historical Provider, MD  aspirin 81 MG tablet Take 81 mg by mouth daily.   Yes Historical Provider, MD  carvedilol (COREG) 25 MG tablet Take 1 tablet (25 mg total) by mouth 2 (two) times daily. 12/24/14  Yes Carollee Leitzarrie M Doss, NP  clopidogrel (PLAVIX) 75 MG tablet Take 75 mg by mouth daily.   Yes Historical Provider, MD  folic acid (FOLVITE) 1 MG tablet Take 1 tablet (1 mg total) by mouth daily. 11/20/14  Yes Carollee Leitz, NP  furosemide (LASIX) 20 MG tablet Take 1 tablet (20 mg total) by mouth daily. Once a day as needed for edema 10/09/14  Yes Carollee Leitz, NP  hydrALAZINE (APRESOLINE) 25 MG tablet Take 1 tablet (25 mg total) by mouth 3 (three) times daily. 10/09/14  Yes Carollee Leitz, NP  isosorbide mononitrate (IMDUR) 60 MG 24 hr tablet TAKE 1 TABLET BY MOUTH TWICE A DAY Patient taking differently: TAKE 1 TABLET BY MOUTH once a day 11/06/14  Yes Carollee Leitz, NP  losartan (COZAAR) 50 MG tablet Take 1 tablet (50 mg  total) by mouth daily. 04/06/14  Yes Raquel Conni Elliot, NP  ranolazine (RANEXA) 500 MG 12 hr tablet Take 1 tablet (500 mg total) by mouth 2 (two) times daily. 10/10/14  Yes Carollee Leitz, NP  albuterol (PROVENTIL HFA;VENTOLIN HFA) 108 (90 BASE) MCG/ACT inhaler Inhale 2 puffs into the lungs every 6 (six) hours as needed for wheezing or shortness of breath. Patient not taking: Reported on 02/16/2015 11/21/14   Carollee Leitz, NP  pravastatin (PRAVACHOL) 20 MG tablet Take 1 tablet (20 mg total) by mouth daily. Patient not taking: Reported on 02/16/2015 10/09/14   Carollee Leitz, NP  traMADol (ULTRAM) 50 MG tablet Take 1 tablet (50 mg total) by mouth every 12 (twelve) hours as needed. 10/01/14   Carollee Leitz, NP    Inpatient Medications:  . amiodarone  200 mg Oral Daily  . aspirin EC  81 mg Oral Daily  . carvedilol  25 mg Oral BID  . cloNIDine  0.2 mg Oral TID  . clopidogrel  75 mg Oral Daily  . docusate sodium  100 mg Oral BID  . folic acid  1 mg Oral Daily  . furosemide  20 mg Oral Daily  . heparin  5,000 Units Subcutaneous 3 times per day  . hydrALAZINE  25 mg Oral TID  . losartan  50 mg Oral Daily  . nitroGLYCERIN  1 inch Topical 4 times per day  . pravastatin  20 mg Oral Daily  . ranolazine  500 mg Oral BID  . sodium chloride  3 mL Intravenous Q12H      Allergies:  Allergies  Allergen Reactions  . Vicodin [Hydrocodone-Acetaminophen]     Patient states, It makes me feel uncomfortable and takes me away from myself.    History   Social History  . Marital Status: Married    Spouse Name: N/A  . Number of Children: 1  . Years of Education: 14   Occupational History  . Control Analyst     Retired   Social History Main Topics  . Smoking status: Former Games developer  . Smokeless tobacco: Never Used  . Alcohol Use: No  . Drug Use: No  . Sexual Activity: Not on file   Other Topics Concern  . Not on file   Social History Narrative   Romy grew up in [place], [place]. She is married to  [place]. They have 1 son [name]), 2 granddaughters [name], [place]) and 1 grandson [name]). Rosaria is retired. She enjoys singing. She sings in the church choir. She also enjoyed her cowboy movies.     Family History  Problem Relation Age of Onset  . Diabetes Mother   . Kidney disease Father     Kidney tumor  . Hypertension Sister      Review of Systems Positive for chest pain shortness of breath weakness and fatigue Negative for: General:  for chills, fever, night sweats or weight changes.  Cardiovascular: PND orthopnea syncope dizziness  Dermatological skin lesions rashes Respiratory: Cough congestion Urologic: Frequent urination urination at night and hematuria Abdominal: negative for nausea, vomiting, diarrhea, bright red blood per rectum, melena, or hematemesis Neurologic: negative for visual changes, and/or hearing changes  All other systems reviewed and are otherwise negative except as noted above.  Labs:  Recent Labs  02/14/15 0321 02/14/15 0741 02/16/15 0330 02/16/15 0540  TROPONINI <0.03 <0.03 <0.03 <0.03   Lab Results  Component Value Date   WBC 10.5 02/16/2015   HGB 10.5* 02/16/2015   HCT 32.7* 02/16/2015   MCV 88.4 02/16/2015   PLT 279 02/16/2015    Recent Labs Lab 02/16/15 0540  NA 136  K 3.6  CL 105  CO2 23  BUN 28*  CREATININE 1.50*  CALCIUM 9.1  PROT 6.5  BILITOT 0.4  ALKPHOS 72  ALT 17  AST 14*  GLUCOSE 94   Lab Results  Component Value Date   CHOL 184 04/05/2014   HDL 46.00 04/05/2014   LDLCALC 123* 04/05/2014   TRIG 74.0 04/05/2014   No results found for: DDIMER  Radiology/Studies:  No results found.  EKG: Normal sinus rhythm with nonspecific ST and T-wave changes but no evidence of myocardial infarction  Weights: Filed Weights   02/16/15 0325 02/16/15 0715  Weight: 161 lb (73.029 kg) 191 lb 1.6 oz (86.682 kg)     Physical Exam: Blood pressure 234/60, pulse 64, temperature 98 F (36.7 C), temperature source  Oral, resp. rate 18, height 5\' 2"  (1.575 m), weight 191 lb 1.6 oz (86.682 kg), SpO2 100 %. Body mass index is 34.94 kg/(m^2). General: Well developed, well nourished, in no acute distress. Head eyes ears nose throat: Normocephalic, atraumatic, sclera non-icteric, no xanthomas, nares are without discharge. No apparent thyromegaly and/or mass  Lungs: Normal respiratory effort. Clear bilaterally to auscultation without wheezes, rales, or rhonchi.  Heart: RRR with normal S1 S2. Without murmur gallop or rub PMI is normal size and placement carotid upstroke normal without bruit jugular venous pressure is normal Abdomen: Soft, non-tender, non-distended with normoactive bowel sounds. No hepatomegaly. No rebound/guarding. No obvious abdominal masses. Abdominal aorta is normal size without bruit Extremities: No edema cyanosis clubbing or ulcers  Peripheral : 2+ bilateral upper extremity pulses, 2+ bilateral femoral pulses 2+ bilateral dorsal pedal pulse Neuro: Alert and oriented X 3. No facial asymmetry. No focal deficit. Moves all extremities spontaneously. Musculoskeletal: Normal muscle tone without kyphosis Psych:  Responds to questions appropriately with a normal affect.    Assessment: 69 year old female with known coronary artery disease status post PCI and stent placement non-valvular Cox's Mall atrial fibrillation mixed hyperlipidemia chronic kidney disease stage III with elevated troponin consistent with demand ischemia without evidence of myocardial infarction having chest discomfort atypical in nature  Plan: 1. Continue serial ECG and enzymes to assess for possible myocardial infarction 2. Nitroglycerin topically for further risk reduction and chest discomfort 3. Possible use of isosorbide 30 mg with ambulation if necessary 4. She has improvements with ambulation and no other further cardiovascular symptoms possible discharge to home for outpatient stress test 5. Continue aggressive hypertension  management with multiple medications listed above including carvedilol clonidine hydralazine 6. Moderate to high intensity cholesterol therapy with pravastatin 7. Your low semi-for chronic kidney disease and shortness of breath 8. No further cardiac diagnostics necessary at this time Signed, Lamar BlinksKOWALSKI,Naman Spychalski J M.D. [facility] 02/16/2015, 9:41 AM

## 2015-02-16 NOTE — H&P (Signed)
Laura Duran is an 69 y.o. female.   Chief Complaint: Chest pain HPI: Patient presents to the hospital complaining of central substernal chest pain 7/10 in severity which progressed to 9-10/10 in severity. The patient states that she still feels "tight" after application of nitroglycerin paste. The pain is nonradiating. She denies nausea, vomiting lightheadedness or diaphoresis. She underwent percutaneous intervention and stent placement 6 weeks ago. Currently she admits to a little shortness of breath. In the emergency department her EKG showed no acute changes and her troponin was negative. However, due to her ongoing chest pain emergency department called for admission.  Past Medical History  Diagnosis Date  . Arthritis   . Hypertension   . Hyperlipidemia   . Gout   . Heart attack 1997, 2001    Dr. Clayborn Bigness every 6 month  . Angina at rest   . CAD (coronary artery disease)   . Paroxysmal atrial fibrillation   . Systolic murmur     Left sternal border  . PVD (peripheral vascular disease)   . Chronic renal insufficiency   . Bilateral carotid bruits   . CHF (congestive heart failure)     history of  . Colon polyps   . Obesity     Past Surgical History  Procedure Laterality Date  . Coronary artery bypass graft  1997    x 4. [facility]  . 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 [facility]    Family History  Problem Relation Age of Onset  . Diabetes Mother   . Kidney disease Father     Kidney tumor  . Hypertension Sister    Social History:  reports that she has quit smoking. She has never used smokeless tobacco. She reports that she does not drink alcohol or use illicit drugs.  Allergies:  Allergies  Allergen Reactions  . Vicodin [Hydrocodone-Acetaminophen]     Patient states, It makes me feel uncomfortable and takes me away from myself.     (Not in a hospital admission)  Results for orders placed or performed during the hospital encounter of 02/16/15 (from the past  48 hour(s))  CBC     Status: Abnormal   Collection Time: 02/16/15  3:30 AM  Result Value Ref Range   WBC 10.7 3.6 - 11.0 K/uL   RBC 3.85 3.80 - 5.20 MIL/uL   Hemoglobin 10.9 (L) 12.0 - 16.0 g/dL   HCT 34.0 (L) 35.0 - 47.0 %   MCV 88.4 80.0 - 100.0 fL   MCH 28.4 26.0 - 34.0 pg   MCHC 32.1 32.0 - 36.0 g/dL   RDW 18.1 (H) 11.5 - 14.5 %   Platelets 288 150 - 440 K/uL  Basic metabolic panel     Status: Abnormal   Collection Time: 02/16/15  3:30 AM  Result Value Ref Range   Sodium 137 135 - 145 mmol/L   Potassium 3.6 3.5 - 5.1 mmol/L   Chloride 105 101 - 111 mmol/L   CO2 24 22 - 32 mmol/L   Glucose, Bld 91 65 - 99 mg/dL   BUN 29 (H) 6 - 20 mg/dL   Creatinine, Ser 1.60 (H) 0.44 - 1.00 mg/dL   Calcium 9.2 8.9 - 10.3 mg/dL   GFR calc non Af Amer 32 (L) >60 mL/min   GFR calc Af Amer 37 (L) >60 mL/min    Comment: (NOTE) The eGFR has been calculated using the CKD EPI equation. This calculation has not been validated in all clinical situations.  eGFR's persistently <60 mL/min signify possible Chronic Kidney Disease.    Anion gap 8 5 - 15  Troponin I     Status: None   Collection Time: 02/16/15  3:30 AM  Result Value Ref Range   Troponin I <0.03 <0.031 ng/mL    Comment:        NO INDICATION OF MYOCARDIAL INJURY.    No results found.  Review of Systems  Constitutional: Negative for fever and chills.  HENT: Negative for sore throat and tinnitus.   Eyes: Negative for blurred vision and redness.  Respiratory: Negative for cough and shortness of breath.   Cardiovascular: Negative for chest pain, palpitations, orthopnea and PND.  Gastrointestinal: Negative for nausea, vomiting, abdominal pain and diarrhea.  Genitourinary: Negative for dysuria, urgency and frequency.  Musculoskeletal: Negative for myalgias and joint pain.  Skin: Negative for rash.       No lesions  Neurological: Negative for speech change, focal weakness and weakness.  Endo/Heme/Allergies: Does not bruise/bleed  easily.       No temperature intolerance  Psychiatric/Behavioral: Negative for depression and suicidal ideas.    Blood pressure 209/57, pulse 73, resp. rate 16, height 5' 2"  (1.575 m), weight 73.029 kg (161 lb), SpO2 99 %. Physical Exam  Nursing note and vitals reviewed. Constitutional: She is oriented to person, place, and time. She appears well-developed and well-nourished.  HENT:  Head: Normocephalic and atraumatic.  Eyes: EOM are normal. Pupils are equal, round, and reactive to light.  Neck: Normal range of motion. No tracheal deviation present. No thyromegaly present.  Cardiovascular: Regular rhythm and normal heart sounds.  Exam reveals no gallop and no friction rub.   No murmur heard. bradycardic  Respiratory: Effort normal and breath sounds normal.  GI: Soft. Bowel sounds are normal. She exhibits no distension. There is no tenderness.  Musculoskeletal:  Left BKA  Lymphadenopathy:    She has no cervical adenopathy.  Neurological: She is alert and oriented to person, place, and time. No cranial nerve deficit. She exhibits normal muscle tone.  Skin: Skin is warm and dry.  Psychiatric: She has a normal mood and affect. Judgment and thought content normal.     Assessment/Plan This is a 69 year old female with known coronary artery disease admitted for persistent chest pain. 1. Chest pain: It appears the patient is suffering from some unstable angina but thankfully does not show symptoms of endocardial ischemia. Likely etiology is known coronary artery disease as well as uncontrolled hypertension.  We will also restart her Ranexa in addition to the topical nitroglycerin paste. Continue Plavix. Follow cardiac biomarkers. A cardiology consult has been ordered. 2. Hypertension: Uncontrolled; we will start the patient's morning blood pressure medicine now and add IV antihypertensive medicine as needed.Continue losartan and hydralazine. 3. Congestive heart failure, systolic: Compensated.  There is no evidence of exacerbation at this time. Continue maintenance dose of Lasix and carvedilol 4. Paroxysmal atrial fibrillation: Continue amiodarone 5. Hyperlipidemia: Continue statin therapy 6. DVT prophylaxis: heparin 7. GI prophylaxis: none The patient is a full code. Time spent on admission orders and patient care approximately 45 minutes  Harrie Foreman 02/16/2015, 6:11 AM

## 2015-02-17 DIAGNOSIS — I129 Hypertensive chronic kidney disease with stage 1 through stage 4 chronic kidney disease, or unspecified chronic kidney disease: Secondary | ICD-10-CM | POA: Diagnosis not present

## 2015-02-17 MED ORDER — HYDRALAZINE HCL 50 MG PO TABS: 50.0000 mg | ORAL_TABLET | Freq: Three times a day (TID) | ORAL | Status: AC

## 2015-02-17 MED ORDER — LOSARTAN POTASSIUM 100 MG PO TABS: 100.0000 mg | ORAL_TABLET | Freq: Every day | ORAL | Status: AC

## 2015-02-17 MED ORDER — LOSARTAN POTASSIUM 50 MG PO TABS: 50.0000 mg | ORAL_TABLET | Freq: Every day | ORAL | Status: DC

## 2015-02-17 NOTE — Discharge Instructions (Signed)

## 2015-02-17 NOTE — Discharge Summary (Signed)
Laura Duran, 69 y.o., DOB 12/23/1945, MRN [id number]. Admission date: 02/16/2015 Discharge Date 02/17/2015 Primary MD Derwood KaplanEason,  Ernest B, MD Admitting Physician Arnaldo NatalMichael S Diamond, MD  Admission Diagnosis  Chest pain, unspecified chest pain type [R07.9]  Discharge Diagnosis   Active Problems:   Essential hypertension   Chest pain   Past Medical History  Diagnosis Date  . Arthritis   . Hypertension   . Hyperlipidemia   . Gout   . Heart attack 1997, 2001    Dr. Juliann Paresallwood every 6 month  . Angina at rest   . CAD (coronary artery disease)   . Paroxysmal atrial fibrillation   . Systolic murmur     Left sternal border  . PVD (peripheral vascular disease)   . Chronic renal insufficiency   . Bilateral carotid bruits   . CHF (congestive heart failure)     history of  . Colon polyps   . Obesity     Past Surgical History  Procedure Laterality Date  . Coronary artery bypass graft  1997    x 4. [facility]  . 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      Hospital Course See H&P, Labs, Consult and Test reports for all details in brief, patient was admitted for chest pressure. Patient was noted to have no significant EKG changes or cardiac enzymes were normal. She was noted to have accelerated hypertension which was appropriately treated with resolution of her symptoms. She was seen by Dr. Rhae LernerKolosky of cardiology who felt that her chest pain may have been cardiac but did not feel that this was an MI. Patient's doing much better and is stable but will need to follow up closely with her primary cardiologist Dr. Vennie Homansalderwood in the next week.  Active Problems:   Essential hypertension   Chest pain    Consults  cardiology  Significant Tests:  See full reports for all details    No results found.     Today   Subjective:   Laura Duran today has no headache,no chest abdominal pain,no new weakness tingling or numbness, feels much better wants to go home today.   Objective:    Blood pressure 135/50, pulse 58, temperature 98.4 F (36.9 C), temperature source Oral, resp. rate 16, height 5\' 2"  (1.575 m), weight 86.047 kg (189 lb 11.2 oz), SpO2 99 %.  .  Intake/Output Summary (Last 24 hours) at 02/17/15 1311 Last data filed at 02/17/15 0830  Gross per 24 hour  Intake    140 ml  Output    250 ml  Net   -110 ml    Exam Awake Alert, Oriented *3, No new F.N deficits, Normal affect [place].AT,PERRAL Supple Neck,No JVD, No cervical lymphadenopathy appreciated.  Symmetrical Chest wall movement, Good air movement bilaterally, CTAB RRR,No Gallops,Rubs or new Murmurs, No Parasternal Heave +ve B.Sounds, Abd Soft, Non tender, No organomegaly appriciated, No rebound -guarding or rigidity. No Cyanosis, Clubbing or edema, No new Rash or bruise  Data Review     CBC w Diff: Lab Results  Component Value Date   WBC 10.5 02/16/2015   WBC 5.8 11/03/2014   HGB 10.5* 02/16/2015   HGB 10.2* 11/04/2014   HCT 32.7* 02/16/2015   HCT 30.8* 11/03/2014   PLT 279 02/16/2015   PLT 294 11/03/2014   LYMPHOPCT 14.0 11/21/2014   LYMPHOPCT 20.1 11/03/2014   MONOPCT 5.6 11/21/2014   MONOPCT 9.5 11/03/2014   EOSPCT 7.3* 11/21/2014   EOSPCT 5.8 11/03/2014  BASOPCT 0.3 11/21/2014   BASOPCT 0.8 11/03/2014   CMP: Lab Results  Component Value Date   NA 136 02/16/2015   NA 140 11/04/2014   K 3.6 02/16/2015   K 5.1 11/04/2014   CL 105 02/16/2015   CL 109* 11/04/2014   CO2 23 02/16/2015   CO2 21 11/04/2014   BUN 28* 02/16/2015   BUN 29* 11/04/2014   CREATININE 1.50* 02/16/2015   CREATININE 2.21* 11/04/2014   PROT 6.5 02/16/2015   PROT 6.5 11/01/2014   ALBUMIN 3.3* 02/16/2015   ALBUMIN 3.2* 11/01/2014   BILITOT 0.4 02/16/2015   ALKPHOS 72 02/16/2015   ALKPHOS 94 11/01/2014   AST 14* 02/16/2015   AST 18 11/01/2014   ALT 17 02/16/2015   ALT 19 11/01/2014  .  Micro Results No results found for this or any previous visit (from the past 240 hour(s)).   Discharge  Instructions      Follow-up Information    Follow up with CALLWOOD,DWAYNE D., MD. Schedule an appointment as soon as possible for a visit in 3 days.   Specialty:  Internal Medicine   Contact information:   [address] [place] [address] [phone]       Follow up with Derwood KaplanEason,  Ernest B, MD In 7 days.   Specialty:  Internal Medicine   Contact information:   [address], [address] [place] [address] [phone]       Discharge Medications     Medication List    TAKE these medications        albuterol 108 (90 BASE) MCG/ACT inhaler  Commonly known as:  PROVENTIL HFA;VENTOLIN HFA  Inhale 2 puffs into the lungs every 6 (six) hours as needed for wheezing or shortness of breath.     ALPRAZolam 0.5 MG tablet  Commonly known as:  XANAX  Take 1 tablet (0.5 mg total) by mouth every 12 (twelve) hours as needed.     amiodarone 200 MG tablet  Commonly known as:  PACERONE  Take 200 mg by mouth daily.     aspirin 81 MG tablet  Take 81 mg by mouth daily.     carvedilol 25 MG tablet  Commonly known as:  COREG  Take 1 tablet (25 mg total) by mouth 2 (two) times daily.     clopidogrel 75 MG tablet  Commonly known as:  PLAVIX  Take 75 mg by mouth daily.     folic acid 1 MG tablet  Commonly known as:  FOLVITE  Take 1 tablet (1 mg total) by mouth daily.     furosemide 20 MG tablet  Commonly known as:  LASIX  Take 1 tablet (20 mg total) by mouth daily. Once a day as needed for edema     hydrALAZINE 50 MG tablet  Commonly known as:  APRESOLINE  Take 1 tablet (50 mg total) by mouth 3 (three) times daily.     isosorbide mononitrate 60 MG 24 hr tablet  Commonly known as:  IMDUR  TAKE 1 TABLET BY MOUTH TWICE A DAY     losartan 100 MG tablet  Commonly known as:  COZAAR  Take 1 tablet (100 mg total) by mouth daily.     pravastatin 20 MG tablet  Commonly known as:  PRAVACHOL  Take 1 tablet (20 mg total) by mouth daily.     ranolazine 500 MG 12 hr tablet   Commonly known as:  RANEXA  Take 1 tablet (500 mg total) by mouth 2 (  two) times daily.     traMADol 50 MG tablet  Commonly known as:  ULTRAM  Take 1 tablet (50 mg total) by mouth every 12 (twelve) hours as needed.         Total Time in preparing paper work, data evaluation and todays exam - 35 minutes  Auburn Bilberry M.D on 02/17/2015 at 1:11 PM  [facility]   Office  [phone]

## 2015-02-17 NOTE — Progress Notes (Signed)
[facility]  Patient: Laura Duran / Admit Date: 02/16/2015 / Date of Encounter: 02/17/2015, 8:58 AM   Subjective: Resolving chest pain with mild shortness of breath  Review of Systems: Positive for: Minimal chest discomfort Negative for: Others previously listed  Objective: Telemetry: Normal sinus rhythm Physical Exam: Blood pressure 135/50, pulse 58, temperature 98.4 F (36.9 C), temperature source Oral, resp. rate 16, height 5\' 2"  (1.575 m), weight 189 lb 11.2 oz (86.047 kg), SpO2 99 %. Body mass index is 34.69 kg/(m^2). General: Well developed, well nourished, in no acute distress. Head: Normocephalic, atraumatic, sclera non-icteric, no xanthomas, nares are without discharge. Neck: No apparent masses Lungs: Normal respirations with no wheezes, no rhonchi, no Rales and/or crackles   Heart: Regular rate and rhythm, normal S1-S2, no murmur, no rub, no gallop, PMI is normal size and placement, carotid upstroke normal without bruit, jugular venous pressure normal Abdomen: Soft, non-tender, non-distended with normoactive bowel sounds. No apparent hepatosplenomegaly. Abdominal aorta is normal size without bruit Extremities: No edema, no clubbing, no cyanosis, no ulcers,  Peripheral: 2+ radial, 2+ femoral, 2+ dorsal pedal pulses Neuro: Alert and oriented X 3. Moves all extremities spontaneously. Psych:  Responds to questions appropriately with a normal affect.   Intake/Output Summary (Last 24 hours) at 02/17/15 0858 Last data filed at 02/17/15 0806  Gross per 24 hour  Intake    620 ml  Output    600 ml  Net     20 ml    Inpatient Medications:  . amiodarone  200 mg Oral Daily  . amLODipine  5 mg Oral Daily  . aspirin EC  81 mg Oral Daily  . carvedilol  25 mg Oral BID  . cloNIDine  0.2 mg Oral TID  . clopidogrel  75 mg Oral Daily  . docusate sodium  100 mg Oral BID  . folic acid  1 mg Oral Daily  . furosemide  20 mg Oral Daily  . heparin   5,000 Units Subcutaneous 3 times per day  . hydrALAZINE  25 mg Oral TID  . losartan  50 mg Oral Daily  . nitroGLYCERIN  1 inch Topical 4 times per day  . pravastatin  20 mg Oral Daily  . ranolazine  500 mg Oral BID  . sodium chloride  3 mL Intravenous Q12H   Infusions:    Labs:  Recent Labs  02/16/15 0330 02/16/15 0540  NA 137 136  K 3.6 3.6  CL 105 105  CO2 24 23  GLUCOSE 91 94  BUN 29* 28*  CREATININE 1.60* 1.50*  CALCIUM 9.2 9.1    Recent Labs  02/16/15 0540  AST 14*  ALT 17  ALKPHOS 72  BILITOT 0.4  PROT 6.5  ALBUMIN 3.3*    Recent Labs  02/16/15 0330 02/16/15 0540  WBC 10.7 10.5  HGB 10.9* 10.5*  HCT 34.0* 32.7*  MCV 88.4 88.4  PLT 288 279    Recent Labs  02/16/15 0330 02/16/15 0540  TROPONINI <0.03 <0.03   Invalid input(s): POCBNP No results for input(s): HGBA1C in the last 72 hours.   Weights: Filed Weights   02/16/15 0325 02/16/15 0715 02/17/15 0345  Weight: 161 lb (73.029 kg) 191 lb 1.6 oz (86.682 kg) 189 lb 11.2 oz (86.047 kg)     Radiology/Studies:  No results found.   Assessment and Recommendation  69 y.o. female with known coronary artery disease status post previous PCI and stent placement with paroxysmal nonvalvular atrial fibrillation  mixed hyperlipidemia chronic kidney disease stage III with minimal elevation of troponin of 0.3 without evidence of myocardial infarction in most consistent with demand ischemia having resolution of her chest discomfort in nature at this time 1. Continue ambulation and follow for any further chest discomfort for further evaluation of possible cardiovascular issues although atypical at this time and possible discharge to home. Would continue ranolazine as well 2. Amiodarone for further maintenance of normal sinus rhythm in her excisional nonvalvular atrial fibrillation 3. Continue current medical regimen including carvedilol and amlodipine for hypertension control improved at this time 4.  Antiplatelet medication management for further risk reduction of coronary artery disease 5. Further cardiac diagnostics necessary at this time 6. Possible discharge to home today if doing well from the cardiac standpoint with follow-up in 2-3 days as an outpatient for further adjustments of medication management  Signed, Arnoldo HookerBruce Abi Shoults M.D. FACC

## 2015-02-17 NOTE — Progress Notes (Signed)
Pt is alertx4. VSS. Complaints of chest pain. Nitro paste applied. Minimial relief. Morphine given. Pt sleeping. Independent in room. Will continue to monitor

## 2015-02-17 NOTE — Progress Notes (Signed)
Pt to be discharged to home today. Iv and tele removed. disch instructions and prescrips given to pt. Pt to be discharged accompanied by husband

## 2015-02-18 ENCOUNTER — Ambulatory Visit: Payer: Medicare Other

## 2015-02-20 ENCOUNTER — Ambulatory Visit: Payer: Medicare Other

## 2015-02-21 ENCOUNTER — Ambulatory Visit: Payer: Medicare Other

## 2015-02-25 ENCOUNTER — Ambulatory Visit: Payer: Medicare Other

## 2015-02-27 ENCOUNTER — Ambulatory Visit: Payer: Medicare Other

## 2015-02-28 ENCOUNTER — Ambulatory Visit: Payer: Medicare Other

## 2015-03-04 ENCOUNTER — Ambulatory Visit: Payer: Medicare Other

## 2015-03-06 ENCOUNTER — Ambulatory Visit: Payer: Medicare Other

## 2015-03-07 ENCOUNTER — Ambulatory Visit: Payer: Medicare Other

## 2015-03-11 ENCOUNTER — Other Ambulatory Visit: Payer: Self-pay | Admitting: Nurse Practitioner

## 2015-03-13 ENCOUNTER — Ambulatory Visit: Payer: Medicare Other

## 2015-03-14 ENCOUNTER — Ambulatory Visit: Payer: Medicare Other

## 2015-03-18 ENCOUNTER — Ambulatory Visit: Payer: Medicare Other

## 2015-03-20 ENCOUNTER — Ambulatory Visit: Payer: Medicare Other

## 2015-03-21 ENCOUNTER — Ambulatory Visit: Payer: Medicare Other

## 2015-03-25 ENCOUNTER — Ambulatory Visit: Payer: Medicare Other

## 2015-03-27 ENCOUNTER — Ambulatory Visit: Payer: Medicare Other

## 2015-03-28 ENCOUNTER — Ambulatory Visit: Payer: Medicare Other

## 2015-03-31 ENCOUNTER — Other Ambulatory Visit: Payer: Self-pay | Admitting: Nurse Practitioner

## 2015-04-01 ENCOUNTER — Ambulatory Visit: Payer: Medicare Other

## 2015-04-03 ENCOUNTER — Ambulatory Visit: Payer: Medicare Other

## 2015-04-04 ENCOUNTER — Ambulatory Visit: Payer: Medicare Other

## 2015-04-08 ENCOUNTER — Ambulatory Visit: Payer: Medicare Other

## 2015-04-10 ENCOUNTER — Ambulatory Visit: Payer: Medicare Other

## 2015-04-11 ENCOUNTER — Ambulatory Visit: Payer: Medicare Other

## 2015-06-06 ENCOUNTER — Other Ambulatory Visit: Payer: Self-pay | Admitting: Nurse Practitioner

## 2017-06-12 DEATH — deceased
